# Patient Record
Sex: Female | Born: 1955
Health system: Southern US, Community
[De-identification: ages and names within clinical notes are randomized; demographics above are authoritative.]

## PROBLEM LIST (undated history)

## (undated) DIAGNOSIS — E162 Hypoglycemia, unspecified: Secondary | ICD-10-CM

## (undated) DIAGNOSIS — R51 Headache: Secondary | ICD-10-CM

## (undated) DIAGNOSIS — E785 Hyperlipidemia, unspecified: Secondary | ICD-10-CM

## (undated) DIAGNOSIS — F419 Anxiety disorder, unspecified: Secondary | ICD-10-CM

## (undated) DIAGNOSIS — N951 Menopausal and female climacteric states: Secondary | ICD-10-CM

## (undated) DIAGNOSIS — G4700 Insomnia, unspecified: Secondary | ICD-10-CM

## (undated) DIAGNOSIS — Z9289 Personal history of other medical treatment: Secondary | ICD-10-CM

## (undated) DIAGNOSIS — R519 Headache, unspecified: Secondary | ICD-10-CM

## (undated) HISTORY — DX: Hyperlipidemia, unspecified: E78.5

## (undated) HISTORY — DX: Headache, unspecified: R51.9

## (undated) HISTORY — DX: Anxiety disorder, unspecified: F41.9

## (undated) HISTORY — DX: Headache: R51

## (undated) HISTORY — DX: Menopausal and female climacteric states: N95.1

## (undated) HISTORY — DX: Personal history of other medical treatment: Z92.89

## (undated) HISTORY — DX: Hypoglycemia, unspecified: E16.2

## (undated) HISTORY — DX: Insomnia, unspecified: G47.00

---

## 2000-05-13 ENCOUNTER — Encounter: Payer: Self-pay | Admitting: Family Medicine

## 2000-05-13 ENCOUNTER — Ambulatory Visit (HOSPITAL_COMMUNITY): Admission: RE | Admit: 2000-05-13 | Discharge: 2000-05-13 | Payer: Self-pay | Admitting: Family Medicine

## 2003-04-25 ENCOUNTER — Other Ambulatory Visit: Admission: RE | Admit: 2003-04-25 | Discharge: 2003-04-25 | Payer: Self-pay | Admitting: Family Medicine

## 2004-04-28 ENCOUNTER — Other Ambulatory Visit: Admission: RE | Admit: 2004-04-28 | Discharge: 2004-04-28 | Payer: Self-pay | Admitting: Family Medicine

## 2005-05-06 ENCOUNTER — Other Ambulatory Visit: Admission: RE | Admit: 2005-05-06 | Discharge: 2005-05-06 | Payer: Self-pay | Admitting: Family Medicine

## 2006-12-14 ENCOUNTER — Other Ambulatory Visit: Admission: RE | Admit: 2006-12-14 | Discharge: 2006-12-14 | Payer: Self-pay | Admitting: Family Medicine

## 2007-09-21 ENCOUNTER — Emergency Department (HOSPITAL_COMMUNITY): Admission: EM | Admit: 2007-09-21 | Discharge: 2007-09-21 | Payer: Self-pay | Admitting: Emergency Medicine

## 2008-01-03 ENCOUNTER — Other Ambulatory Visit: Admission: RE | Admit: 2008-01-03 | Discharge: 2008-01-03 | Payer: Self-pay | Admitting: Family Medicine

## 2008-07-12 ENCOUNTER — Encounter: Admission: RE | Admit: 2008-07-12 | Discharge: 2008-07-12 | Payer: Self-pay | Admitting: Gastroenterology

## 2008-10-07 ENCOUNTER — Ambulatory Visit (HOSPITAL_COMMUNITY): Admission: RE | Admit: 2008-10-07 | Discharge: 2008-10-07 | Payer: Self-pay | Admitting: Gastroenterology

## 2008-10-23 ENCOUNTER — Encounter: Admission: RE | Admit: 2008-10-23 | Discharge: 2008-10-23 | Payer: Self-pay | Admitting: Family Medicine

## 2008-11-25 ENCOUNTER — Encounter: Admission: RE | Admit: 2008-11-25 | Discharge: 2008-11-25 | Payer: Self-pay | Admitting: Gastroenterology

## 2009-02-24 ENCOUNTER — Other Ambulatory Visit: Admission: RE | Admit: 2009-02-24 | Discharge: 2009-02-24 | Payer: Self-pay | Admitting: Family Medicine

## 2010-02-16 ENCOUNTER — Encounter: Payer: Self-pay | Admitting: Gastroenterology

## 2011-10-18 ENCOUNTER — Other Ambulatory Visit: Payer: Self-pay | Admitting: Family Medicine

## 2011-10-18 DIAGNOSIS — Z1231 Encounter for screening mammogram for malignant neoplasm of breast: Secondary | ICD-10-CM

## 2011-11-04 ENCOUNTER — Ambulatory Visit
Admission: RE | Admit: 2011-11-04 | Discharge: 2011-11-04 | Disposition: A | Discharge status: Home / Self Care | Payer: BC Managed Care – PPO | Source: Ambulatory Visit | Attending: Family Medicine | Admitting: Family Medicine

## 2011-11-04 DIAGNOSIS — Z1231 Encounter for screening mammogram for malignant neoplasm of breast: Secondary | ICD-10-CM

## 2012-02-15 ENCOUNTER — Other Ambulatory Visit: Payer: Self-pay | Admitting: Family Medicine

## 2012-02-15 ENCOUNTER — Other Ambulatory Visit (HOSPITAL_COMMUNITY)
Admission: RE | Admit: 2012-02-15 | Discharge: 2012-02-15 | Disposition: A | Discharge status: Home / Self Care | Payer: BC Managed Care – PPO | Source: Ambulatory Visit | Attending: Family Medicine | Admitting: Family Medicine

## 2012-02-15 DIAGNOSIS — Z0000 Encounter for general adult medical examination without abnormal findings: Secondary | ICD-10-CM | POA: Insufficient documentation

## 2012-10-03 ENCOUNTER — Other Ambulatory Visit: Payer: Self-pay

## 2012-10-03 DIAGNOSIS — Z1231 Encounter for screening mammogram for malignant neoplasm of breast: Secondary | ICD-10-CM

## 2012-11-06 ENCOUNTER — Ambulatory Visit
Admission: RE | Admit: 2012-11-06 | Discharge: 2012-11-06 | Disposition: A | Discharge status: Home / Self Care | Payer: BC Managed Care – PPO | Source: Ambulatory Visit

## 2012-11-06 DIAGNOSIS — Z1231 Encounter for screening mammogram for malignant neoplasm of breast: Secondary | ICD-10-CM

## 2013-10-03 ENCOUNTER — Other Ambulatory Visit: Payer: Self-pay

## 2013-10-03 DIAGNOSIS — Z1231 Encounter for screening mammogram for malignant neoplasm of breast: Secondary | ICD-10-CM

## 2013-11-13 ENCOUNTER — Ambulatory Visit
Admission: RE | Admit: 2013-11-13 | Discharge: 2013-11-13 | Disposition: A | Discharge status: Home / Self Care | Payer: BC Managed Care – PPO | Source: Ambulatory Visit

## 2013-11-13 DIAGNOSIS — Z1231 Encounter for screening mammogram for malignant neoplasm of breast: Secondary | ICD-10-CM

## 2014-12-18 ENCOUNTER — Other Ambulatory Visit: Payer: Self-pay

## 2014-12-18 DIAGNOSIS — Z1231 Encounter for screening mammogram for malignant neoplasm of breast: Secondary | ICD-10-CM

## 2015-01-24 ENCOUNTER — Ambulatory Visit: Payer: Self-pay

## 2015-01-27 MED FILL — SIMVASTATIN 20 MG TABLET: 20 | 30 days supply | Qty: 30 | Fill #3

## 2015-02-06 ENCOUNTER — Ambulatory Visit
Admission: RE | Admit: 2015-02-06 | Discharge: 2015-02-06 | Disposition: A | Discharge status: Home / Self Care | Payer: BLUE CROSS/BLUE SHIELD | Source: Ambulatory Visit

## 2015-02-06 DIAGNOSIS — Z1231 Encounter for screening mammogram for malignant neoplasm of breast: Secondary | ICD-10-CM

## 2015-02-24 MED FILL — SIMVASTATIN 20 MG TABLET: 20 | 30 days supply | Qty: 30 | Fill #4

## 2015-03-06 MED FILL — SERTRALINE HCL 25 MG TABLET: 25 | 30 days supply | Qty: 30 | Fill #2

## 2015-03-27 MED FILL — SIMVASTATIN 20 MG TABLET: 20 | 30 days supply | Qty: 30 | Fill #5

## 2015-04-21 MED FILL — XIIDRA 5% EYE DROPS: 5 | 30 days supply | Qty: 60 | Fill #0

## 2015-04-25 ENCOUNTER — Encounter: Payer: Self-pay | Admitting: *Deleted

## 2015-04-28 ENCOUNTER — Ambulatory Visit (INDEPENDENT_AMBULATORY_CARE_PROVIDER_SITE_OTHER): Payer: BLUE CROSS/BLUE SHIELD | Admitting: Neurology

## 2015-04-28 ENCOUNTER — Encounter: Payer: Self-pay | Admitting: Neurology

## 2015-04-28 VITALS — BP 107/71 | HR 70 | Ht 67.0 in | Wt 131.5 lb

## 2015-04-28 DIAGNOSIS — G441 Vascular headache, not elsewhere classified: Secondary | ICD-10-CM

## 2015-04-28 DIAGNOSIS — R519 Headache, unspecified: Secondary | ICD-10-CM | POA: Insufficient documentation

## 2015-04-28 DIAGNOSIS — R51 Headache: Secondary | ICD-10-CM

## 2015-04-28 MED ORDER — ALPRAZOLAM 0.5 MG PO TABS: ORAL_TABLET | ORAL | Status: DC

## 2015-04-28 NOTE — Progress Notes (Signed)
Reason for visit: Headache  Referring physician: Dr. Carol Ada  Erika Wilkins is a 60 y.o. female  History of present illness:  Erika Wilkins is a 60 year old right-handed white female with out significant medical history. She indicates that around 09/26/2014 she was carrying heavy boxes, on the drive back home she began noting a relatively sudden onset of some sensation of nausea, lightheadedness, generalized weakness. She felt poorly for several days. She had blood work done within the next week, a CBC, comprehensive metabolic profile, and TSH were unremarkable with exception of a slightly high calcium level. She was seen by her eye doctor around September 26 when she developed some achy pain and dryness of the left eye. The patient was never found to have any eye pathology. She underwent an evaluation through her dentist for possible TMJ, she was felt to have some crepitus in the TMJ joints. By March, she was having some neck discomfort and some headache pain in the back of the head, particularly on the right. She has also noted some "crinkling" sensations inside the head, and she also has similar sensations when she turns her head with a sensation in the neck. The patient notes that if she puts pressure on the back of the head, the head pain increases. She denies any numbness or weakness of the arms, body, or legs. She denies any loss of vision, or double vision. She has not had any weakness or gait instability or problems controlling the bowels or the bladder. She is sent to this office for an evaluation.  Past Medical History  Diagnosis Date  . Hyperlipidemia   . Insomnia   . Hypoglycemia   . Menopausal symptoms   . History of nuclear stress test     Normal nuclear stress test 07/14/2009 dr. Marlou Porch  . Anxiety   . Headache     History reviewed. No pertinent past surgical history.  Family History  Problem Relation Age of Onset  . Heart disease Mother   . CAD Mother   .  Parkinson's disease Father   . Diabetes Sister   . Liver cancer Brother   . CAD Paternal Grandfather   . Pneumonia Brother     Social history:  reports that she has never smoked. She does not have any smokeless tobacco history on file. She reports that she does not drink alcohol or use illicit drugs.  Medications:  Prior to Admission medications   Medication Sig Start Date End Date Taking? Authorizing Provider  LORazepam (ATIVAN) 0.5 MG tablet Take 0.5 mg by mouth daily as needed for anxiety (Takes 1/2 tablet prn).   Yes Historical Provider, Wilkins  sertraline (ZOLOFT) 25 MG tablet Take 25 mg by mouth daily. She takes 1/2 tablet   Yes Historical Provider, Wilkins  simvastatin (ZOCOR) 20 MG tablet Take 20 mg by mouth daily. Simvastatin 20 MG Tablet TAKE 1 TABLET BY MOUTH ONCE A DAY Once a day   Yes Historical Provider, Wilkins  XIIDRA 5 % SOLN  04/21/15  Yes Historical Provider, Wilkins     No Known Allergies  ROS:  Out of a complete 14 system review of symptoms, the patient complains only of the following symptoms, and all other reviewed systems are negative.  Eye pain Increased thirst Headache Anxiety  Blood pressure 107/71, pulse 70, height 5\' 7"  (1.702 m), weight 131 lb 8 oz (59.648 kg).  Physical Exam  General: The patient is alert and cooperative at the time of the examination.  Eyes: Pupils are equal, round, and reactive to light. Discs are flat bilaterally.  Neck: The neck is supple, no carotid bruits are noted.  Respiratory: The respiratory examination is clear.  Cardiovascular: The cardiovascular examination reveals a regular rate and rhythm, no obvious murmurs or rubs are noted.  Neuromuscular: Range of movement of the cervical spine is full. The patient has crepitus in the temporomandibular joints bilaterally, left greater than right.  Skin: Extremities are without significant edema.  Neurologic Exam  Mental status: The patient is alert and oriented x 3 at the time of the  examination. The patient has apparent normal recent and remote memory, with an apparently normal attention span and concentration ability.  Cranial nerves: Facial symmetry is present. There is good sensation of the face to pinprick and soft touch bilaterally. The strength of the facial muscles and the muscles to head turning and shoulder shrug are normal bilaterally. Speech is well enunciated, no aphasia or dysarthria is noted. Extraocular movements are full. Visual fields are full. The tongue is midline, and the patient has symmetric elevation of the soft palate. No obvious hearing deficits are noted.  Motor: The motor testing reveals 5 over 5 strength of all 4 extremities. Good symmetric motor tone is noted throughout.  Sensory: Sensory testing is intact to pinprick, soft touch, vibration sensation, and position sense on all 4 extremities. No evidence of extinction is noted.  Coordination: Cerebellar testing reveals good finger-nose-finger and heel-to-shin bilaterally.  Gait and station: Gait is normal. Tandem gait is normal. Romberg is negative. No drift is seen.  Reflexes: Deep tendon reflexes are symmetric and normal bilaterally. Toes are downgoing bilaterally.   Assessment/Plan:  1. Left eye discomfort  2. Posterior head pain  The patient reports some discomfort around the left eye, a "crinkling sensation" inside the head, she also reports sensations consistent with crepitus in the neck. The patient reports posterior head pain and some neck discomfort. The patient could have a cervicogenic headache. She denies any prior history of migraine headache earlier in her life. The patient will be set up for some blood work today, and she will have MRI evaluation of the brain. The clinical examination today is unremarkable. If the studies are normal, and the patient is still having ongoing symptoms, we may consider initiating treatment of the headache with gabapentin. She will follow-up in 4  months. A prescription was given for Xanax for the MRI.  Erika Wilkins 04/28/2015 1:21 PM  [facility] [address] [place], [place] [address]  Phone [phone] Fax [phone]

## 2015-04-29 ENCOUNTER — Telehealth: Payer: Self-pay | Admitting: Neurology

## 2015-04-29 LAB — BASIC METABOLIC PANEL
BUN/Creatinine Ratio: 16 (ref 12–28)
BUN: 13 mg/dL (ref 8–27)
CALCIUM: 10.1 mg/dL (ref 8.7–10.3)
CO2: 28 mmol/L (ref 18–29)
CREATININE: 0.82 mg/dL (ref 0.57–1.00)
Chloride: 100 mmol/L (ref 96–106)
GFR, EST AFRICAN AMERICAN: 90 mL/min/{1.73_m2} (ref >59)
GFR, EST NON AFRICAN AMERICAN: 78 mL/min/{1.73_m2} (ref >59)
Glucose: 89 mg/dL (ref 65–99)
POTASSIUM: 5.1 mmol/L (ref 3.5–5.2)
Sodium: 143 mmol/L (ref 134–144)

## 2015-04-29 LAB — ANA W/REFLEX: Anti Nuclear Antibody(ANA): NEGATIVE

## 2015-04-29 LAB — SEDIMENTATION RATE: SED RATE: 2 mm/h (ref 0–40)

## 2015-04-29 NOTE — Telephone Encounter (Signed)
Spoke with patient and advised her that most likely Dr Jannifer Franklin would not prescribe at this time since his office note did not indicate he would until after her studies are completed. Advised she take Ibuprofen and /or Tylenol if these are medications she can take. She stated she can and would continue with these. Her MRI is this weekend. Advised she call for any new issues, problems. She verbalized understanding, appreciation.

## 2015-04-29 NOTE — Telephone Encounter (Signed)
Pt said she forgot to ask at appt yesterday what pain medication for headaches would Dr Jannifer Franklin recommend, otc or RX until she has MRI.

## 2015-04-30 ENCOUNTER — Other Ambulatory Visit (HOSPITAL_COMMUNITY)
Admission: RE | Admit: 2015-04-30 | Discharge: 2015-04-30 | Disposition: A | Discharge status: Home / Self Care | Payer: BLUE CROSS/BLUE SHIELD | Source: Ambulatory Visit | Attending: Family Medicine | Admitting: Family Medicine

## 2015-04-30 ENCOUNTER — Other Ambulatory Visit: Payer: Self-pay | Admitting: Family Medicine

## 2015-04-30 ENCOUNTER — Telehealth: Payer: Self-pay | Admitting: *Deleted

## 2015-04-30 DIAGNOSIS — Z124 Encounter for screening for malignant neoplasm of cervix: Secondary | ICD-10-CM | POA: Diagnosis not present

## 2015-04-30 DIAGNOSIS — Z1211 Encounter for screening for malignant neoplasm of colon: Secondary | ICD-10-CM | POA: Diagnosis not present

## 2015-04-30 DIAGNOSIS — Z0100 Encounter for examination of eyes and vision without abnormal findings: Secondary | ICD-10-CM | POA: Diagnosis not present

## 2015-04-30 DIAGNOSIS — Z01419 Encounter for gynecological examination (general) (routine) without abnormal findings: Secondary | ICD-10-CM | POA: Diagnosis not present

## 2015-04-30 DIAGNOSIS — E7800 Pure hypercholesterolemia, unspecified: Secondary | ICD-10-CM | POA: Diagnosis not present

## 2015-04-30 DIAGNOSIS — Z0000 Encounter for general adult medical examination without abnormal findings: Secondary | ICD-10-CM | POA: Diagnosis not present

## 2015-04-30 DIAGNOSIS — H5310 Unspecified subjective visual disturbances: Secondary | ICD-10-CM | POA: Diagnosis not present

## 2015-04-30 MED FILL — SIMVASTATIN 20 MG TABLET: 20 | 90 days supply | Qty: 90 | Fill #0

## 2015-04-30 MED FILL — ALPRAZolam 0.5 MG TABS: 0.5 | 1 days supply | Qty: 3 | Fill #0

## 2015-04-30 MED FILL — SERTRALINE HCL 25 MG TABLET: 25 | 90 days supply | Qty: 90 | Fill #0

## 2015-04-30 NOTE — Telephone Encounter (Signed)
-----   Message from Kathrynn Ducking, MD sent at 04/29/2015  4:37 PM EDT -----  The blood work results are unremarkable. Please call the patient.  ----- Message -----    From: [facility] Lab Results In Interface    Sent: 04/29/2015   7:42 AM      To: Kathrynn Ducking, MD

## 2015-04-30 NOTE — Telephone Encounter (Signed)
Spoke to pt and relayed lab results.   Faxed to Dr. Carol Ada, since pt has appt today at 1000.  (received fax confirmation).  She was asking if ok to take excedrin migraine for her headache (as motrin and tylenol not helping), she may try this and see if helps.  Otherwise will await other test results.

## 2015-05-02 LAB — CYTOLOGY - PAP

## 2015-05-03 ENCOUNTER — Ambulatory Visit
Admission: RE | Admit: 2015-05-03 | Discharge: 2015-05-03 | Disposition: A | Discharge status: Home / Self Care | Payer: BLUE CROSS/BLUE SHIELD | Source: Ambulatory Visit | Attending: Neurology | Admitting: Neurology

## 2015-05-03 DIAGNOSIS — G441 Vascular headache, not elsewhere classified: Secondary | ICD-10-CM | POA: Diagnosis not present

## 2015-05-03 DIAGNOSIS — R51 Headache: Secondary | ICD-10-CM | POA: Diagnosis not present

## 2015-05-03 MED ORDER — GADOBENATE DIMEGLUMINE 529 MG/ML IV SOLN
12.0000 mL | Freq: Once | INTRAVENOUS | Status: AC | PRN
  Administered 2015-05-03: 12 mL via INTRAVENOUS

## 2015-05-05 ENCOUNTER — Other Ambulatory Visit (INDEPENDENT_AMBULATORY_CARE_PROVIDER_SITE_OTHER): Payer: Self-pay

## 2015-05-05 ENCOUNTER — Telehealth: Payer: Self-pay | Admitting: Neurology

## 2015-05-05 DIAGNOSIS — G441 Vascular headache, not elsewhere classified: Secondary | ICD-10-CM | POA: Diagnosis not present

## 2015-05-05 DIAGNOSIS — Z0289 Encounter for other administrative examinations: Secondary | ICD-10-CM

## 2015-05-05 NOTE — Telephone Encounter (Signed)
I called patient. MRI the brain shows mild white matter changes that are nonspecific, not typical for MS. The patient does not have a significant sinus disease. The patient does report exposure to ticks in August 2016. We will check a Lyme antibody panel.   MRI brain 05/02/15:  IMPRESSION: This MRI of the brain with and without contrast shows the following: 1. Many T2/FLAIR hyperintense foci in the deep and subcortical white matter of both hemispheres. This is a nonspecific finding but most likely represents chronic microvascular ischemic change. The extent is more than expected for age. Demyelination would be less likely to give this pattern. 2. There are no acute findings.

## 2015-05-06 ENCOUNTER — Telehealth: Payer: Self-pay

## 2015-05-06 LAB — B. BURGDORFI ANTIBODIES: Lyme IgG/IgM Ab: <0.91 {ISR} (ref 0.00–0.90)

## 2015-05-06 NOTE — Telephone Encounter (Signed)
Spoke to [place] and gave normal lab results. She verbalized understanding and appreciation for call.

## 2015-05-06 NOTE — Telephone Encounter (Signed)
-----   Message from Kathrynn Ducking, MD sent at 05/06/2015  2:45 PM EDT -----  The blood work results are unremarkable. Please call the patient.  ----- Message -----    From: [facility] Lab Results In Interface    Sent: 05/06/2015   1:37 PM      To: Kathrynn Ducking, MD

## 2015-07-28 MED FILL — SIMVASTATIN 20 MG TABLET: 20 | 90 days supply | Qty: 90 | Fill #1

## 2015-08-18 DIAGNOSIS — J309 Allergic rhinitis, unspecified: Secondary | ICD-10-CM | POA: Diagnosis not present

## 2015-08-18 DIAGNOSIS — K219 Gastro-esophageal reflux disease without esophagitis: Secondary | ICD-10-CM | POA: Diagnosis not present

## 2015-08-18 DIAGNOSIS — J320 Chronic maxillary sinusitis: Secondary | ICD-10-CM | POA: Diagnosis not present

## 2015-08-18 DIAGNOSIS — R51 Headache: Secondary | ICD-10-CM | POA: Diagnosis not present

## 2015-08-29 ENCOUNTER — Ambulatory Visit: Payer: BLUE CROSS/BLUE SHIELD | Admitting: Neurology

## 2015-10-27 MED FILL — SIMVASTATIN 20 MG TABLET: 20 | 90 days supply | Qty: 90 | Fill #0

## 2015-10-30 MED FILL — SERTRALINE HCL 25 MG TABLET: 25 | 90 days supply | Qty: 90 | Fill #1

## 2015-11-04 DIAGNOSIS — F419 Anxiety disorder, unspecified: Secondary | ICD-10-CM | POA: Diagnosis not present

## 2015-11-04 DIAGNOSIS — E7800 Pure hypercholesterolemia, unspecified: Secondary | ICD-10-CM | POA: Diagnosis not present

## 2015-11-18 MED FILL — LORazepam 0.5 MG TABS: 0.5 | 10 days supply | Qty: 30 | Fill #0

## 2015-11-28 DIAGNOSIS — H43812 Vitreous degeneration, left eye: Secondary | ICD-10-CM | POA: Diagnosis not present

## 2016-01-09 DIAGNOSIS — H43812 Vitreous degeneration, left eye: Secondary | ICD-10-CM | POA: Diagnosis not present

## 2016-02-03 MED FILL — SIMVASTATIN 20 MG TABLET: 20 | 45 days supply | Qty: 90 | Fill #0

## 2016-05-07 MED FILL — SIMVASTATIN 20 MG TABLET: 20 | 45 days supply | Qty: 90 | Fill #1

## 2016-05-12 DIAGNOSIS — Z0000 Encounter for general adult medical examination without abnormal findings: Secondary | ICD-10-CM | POA: Diagnosis not present

## 2016-05-12 DIAGNOSIS — E7800 Pure hypercholesterolemia, unspecified: Secondary | ICD-10-CM | POA: Diagnosis not present

## 2016-05-12 DIAGNOSIS — F419 Anxiety disorder, unspecified: Secondary | ICD-10-CM | POA: Diagnosis not present

## 2016-05-17 ENCOUNTER — Other Ambulatory Visit: Payer: Self-pay | Admitting: Family Medicine

## 2016-05-17 DIAGNOSIS — Z1231 Encounter for screening mammogram for malignant neoplasm of breast: Secondary | ICD-10-CM

## 2016-06-02 ENCOUNTER — Ambulatory Visit
Admission: RE | Admit: 2016-06-02 | Discharge: 2016-06-02 | Disposition: A | Discharge status: Home / Self Care | Payer: BLUE CROSS/BLUE SHIELD | Source: Ambulatory Visit | Attending: Family Medicine | Admitting: Family Medicine

## 2016-06-02 DIAGNOSIS — Z1231 Encounter for screening mammogram for malignant neoplasm of breast: Secondary | ICD-10-CM

## 2016-06-10 MED FILL — SERTRALINE HCL 25 MG TABLET: 25 | 90 days supply | Qty: 90 | Fill #0

## 2016-07-27 DIAGNOSIS — R0789 Other chest pain: Secondary | ICD-10-CM | POA: Diagnosis not present

## 2016-07-27 DIAGNOSIS — S161XXA Strain of muscle, fascia and tendon at neck level, initial encounter: Secondary | ICD-10-CM | POA: Diagnosis not present

## 2016-08-05 MED FILL — SIMVASTATIN 20 MG TABLET: 20 | 90 days supply | Qty: 90 | Fill #0

## 2016-08-06 DIAGNOSIS — H43811 Vitreous degeneration, right eye: Secondary | ICD-10-CM | POA: Diagnosis not present

## 2016-09-01 DIAGNOSIS — H43811 Vitreous degeneration, right eye: Secondary | ICD-10-CM | POA: Diagnosis not present

## 2016-11-01 MED FILL — SIMVASTATIN 20 MG TABLET: 20 | 90 days supply | Qty: 90 | Fill #1

## 2016-12-07 MED FILL — LORazepam 0.5 MG TABS: 0.5 | 10 days supply | Qty: 30 | Fill #0

## 2016-12-07 MED FILL — SERTRALINE HCL 25 MG TABLET: 25 | 90 days supply | Qty: 90 | Fill #1

## 2017-02-04 MED FILL — SIMVASTATIN 20 MG TABLET: 20 | 90 days supply | Qty: 90 | Fill #0

## 2017-05-04 MED FILL — SIMVASTATIN 20 MG TABS: 20 | 90 days supply | Qty: 90 | Fill #1

## 2017-05-19 DIAGNOSIS — Z0000 Encounter for general adult medical examination without abnormal findings: Secondary | ICD-10-CM | POA: Diagnosis not present

## 2017-05-19 DIAGNOSIS — E7800 Pure hypercholesterolemia, unspecified: Secondary | ICD-10-CM | POA: Diagnosis not present

## 2017-06-01 DIAGNOSIS — H1033 Unspecified acute conjunctivitis, bilateral: Secondary | ICD-10-CM | POA: Diagnosis not present

## 2017-07-13 ENCOUNTER — Other Ambulatory Visit: Payer: Self-pay | Admitting: Family Medicine

## 2017-07-13 DIAGNOSIS — Z1231 Encounter for screening mammogram for malignant neoplasm of breast: Secondary | ICD-10-CM

## 2017-08-03 ENCOUNTER — Ambulatory Visit
Admission: RE | Admit: 2017-08-03 | Discharge: 2017-08-03 | Disposition: A | Discharge status: Home / Self Care | Payer: BLUE CROSS/BLUE SHIELD | Source: Ambulatory Visit | Attending: Family Medicine | Admitting: Family Medicine

## 2017-08-03 DIAGNOSIS — Z1231 Encounter for screening mammogram for malignant neoplasm of breast: Secondary | ICD-10-CM

## 2017-08-17 MED FILL — SIMVASTATIN 20 MG TABLET: 20 | 90 days supply | Qty: 90 | Fill #0

## 2017-09-13 MED FILL — PEG-3350 SOLUTION: 420 | 1 days supply | Qty: 4000 | Fill #0

## 2017-09-15 DIAGNOSIS — Z1211 Encounter for screening for malignant neoplasm of colon: Secondary | ICD-10-CM | POA: Diagnosis not present

## 2017-09-15 DIAGNOSIS — D126 Benign neoplasm of colon, unspecified: Secondary | ICD-10-CM | POA: Diagnosis not present

## 2017-09-20 DIAGNOSIS — D126 Benign neoplasm of colon, unspecified: Secondary | ICD-10-CM | POA: Diagnosis not present

## 2017-10-14 DIAGNOSIS — H2513 Age-related nuclear cataract, bilateral: Secondary | ICD-10-CM | POA: Diagnosis not present

## 2017-10-14 DIAGNOSIS — H5712 Ocular pain, left eye: Secondary | ICD-10-CM | POA: Diagnosis not present

## 2017-10-25 DIAGNOSIS — J069 Acute upper respiratory infection, unspecified: Secondary | ICD-10-CM | POA: Diagnosis not present

## 2017-12-26 DIAGNOSIS — E7800 Pure hypercholesterolemia, unspecified: Secondary | ICD-10-CM | POA: Diagnosis not present

## 2017-12-26 DIAGNOSIS — R079 Chest pain, unspecified: Secondary | ICD-10-CM | POA: Diagnosis not present

## 2017-12-26 DIAGNOSIS — R1012 Left upper quadrant pain: Secondary | ICD-10-CM | POA: Diagnosis not present

## 2017-12-28 ENCOUNTER — Telehealth: Payer: Self-pay

## 2017-12-28 NOTE — Telephone Encounter (Signed)
SENT REFERRAL TO SCHEDULING AND FILED NOTES 

## 2018-01-04 MED FILL — SIMVASTATIN 20 MG TABLET: 20 | 90 days supply | Qty: 90 | Fill #0

## 2018-01-19 ENCOUNTER — Ambulatory Visit: Payer: BLUE CROSS/BLUE SHIELD | Admitting: Cardiovascular Disease

## 2018-01-19 ENCOUNTER — Encounter: Payer: Self-pay | Admitting: Cardiovascular Disease

## 2018-01-19 DIAGNOSIS — Z8249 Family history of ischemic heart disease and other diseases of the circulatory system: Secondary | ICD-10-CM | POA: Insufficient documentation

## 2018-01-19 DIAGNOSIS — E782 Mixed hyperlipidemia: Secondary | ICD-10-CM

## 2018-01-19 DIAGNOSIS — R0789 Other chest pain: Secondary | ICD-10-CM | POA: Insufficient documentation

## 2018-01-19 DIAGNOSIS — E785 Hyperlipidemia, unspecified: Secondary | ICD-10-CM | POA: Insufficient documentation

## 2018-01-19 NOTE — Patient Instructions (Addendum)
Medication Instructions:  Your physician recommends that you continue on your current medications as directed. Please refer to the Current Medication list given to you today.  If you need a refill on your cardiac medications before your next appointment, please call your pharmacy.   Lab work: NONE If you have labs (blood work) drawn today and your tests are completely normal, you will receive your results only by: Marland Kitchen MyChart Message (if you have MyChart) OR . A paper copy in the mail If you have any lab test that is abnormal or we need to change your treatment, we will call you to review the results.  Testing/Procedures: Your physician has requested that you have an exercise tolerance test. For further information please visit [email or web address]. Please also follow instruction sheet, as given.   Follow-Up: At [facility], you and your health needs are our priority.  As part of our continuing mission to provide you with exceptional heart care, we have created designated Provider Care Teams.  These Care Teams include your primary Cardiologist (physician) and Advanced Practice Providers (APPs -  Physician Assistants and Nurse Practitioners) who all work together to provide you with the care you need, when you need it. You will need a follow up appointment in 12 months.  Please call our office 2 months in advance to schedule this appointment.  You may see DR. BERRY  or one of the following Advanced Practice Providers on your designated Care Team:   Kerin Ransom, PA-C [place], [place] . Sande Rives, PA-C

## 2018-01-19 NOTE — Assessment & Plan Note (Signed)
History of hyperlipidemia on simvastatin in the past which she stopped in August on her own.  Her most recent lipid profile performed 12/27/2017 revealed total cholesterol 260, LDL 146 and HDL of 83.  She was begun back on her simvastatin by her PCP.

## 2018-01-19 NOTE — Progress Notes (Signed)
01/19/2018 TASNIM BALENTINE   May 26, 1955  [id number]  Primary Physician Carol Ada, MD Primary Cardiologist: Lorretta Harp MD [place], [place]  HPI:  Erika Wilkins is a 62 y.o. thin appearing married Caucasian female mother of 2 children, grandmother of one grandchild who works in a Administrator, [employer] and was referred by Dr. Carol Ada for cardiovascular valuation because of atypical chest pain.  Her only risk factors include treated hyperlipidemia and family history with a mother who died of a myocardial infarction age 70 and a sister who is had a an MI as well.  She is never had a heart attack or stroke.  She is otherwise asymptomatic.  She had 2 episodes of brief chest pain around Thanksgiving and none since.  She has been evaluated by Dr. Marlou Porch approximately 8 years ago for similar symptoms and had negative stress test at that time.   Current Meds  Medication Sig  . LORazepam (ATIVAN) 0.5 MG tablet Take 0.5 mg by mouth daily as needed for anxiety (Takes 1/2 tablet prn).  . simvastatin (ZOCOR) 20 MG tablet Take 20 mg by mouth daily. Simvastatin 20 MG Tablet TAKE 1 TABLET BY MOUTH ONCE A DAY Once a day     No Known Allergies  Social History   Socioeconomic History  . Marital status: Married    Spouse name: Not on file  . Number of children: Not on file  . Years of education: Not on file  . Highest education level: Not on file  Occupational History  . Not on file  Social Needs  . Financial resource strain: Not on file  . Food insecurity:    Worry: Not on file    Inability: Not on file  . Transportation needs:    Medical: Not on file    Non-medical: Not on file  Tobacco Use  . Smoking status: Never Smoker  . Smokeless tobacco: Never Used  Substance and Sexual Activity  . Alcohol use: No    Alcohol/week: 0.0 standard drinks  . Drug use: No  . Sexual activity: Not on file  Lifestyle  . Physical activity:    Days per week: Not on file    Minutes  per session: Not on file  . Stress: Not on file  Relationships  . Social connections:    Talks on phone: Not on file    Gets together: Not on file    Attends religious service: Not on file    Active member of club or organization: Not on file    Attends meetings of clubs or organizations: Not on file    Relationship status: Not on file  . Intimate partner violence:    Fear of current or ex partner: Not on file    Emotionally abused: Not on file    Physically abused: Not on file    Forced sexual activity: Not on file  Other Topics Concern  . Not on file  Social History Narrative   Married to [place], 2 boys Jonni Sanger and Loa Socks).   College grad [employer].  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       Review of Systems: General: negative for chills, fever, night sweats or weight changes.  Cardiovascular: negative for chest pain, dyspnea on exertion, edema, orthopnea, palpitations, paroxysmal nocturnal dyspnea or shortness of breath Dermatological: negative for rash Respiratory: negative for cough or wheezing Urologic: negative for hematuria Abdominal: negative for nausea, vomiting, diarrhea, bright red blood per rectum, melena, or hematemesis  Neurologic: negative for visual changes, syncope, or dizziness All other systems reviewed and are otherwise negative except as noted above.    Blood pressure 114/74, pulse 73, height 5\' 7"  (1.702 m), weight 128 lb (58.1 kg).  General appearance: alert and no distress Neck: no adenopathy, no carotid bruit, no JVD, supple, symmetrical, trachea midline and thyroid not enlarged, symmetric, no tenderness/mass/nodules Lungs: clear to auscultation bilaterally Heart: regular rate and rhythm, S1, S2 normal, no murmur, click, rub or gallop Extremities: extremities normal, atraumatic, no cyanosis or edema Pulses: 2+ and symmetric Skin: Skin color, texture, turgor normal. No rashes or lesions Neurologic: Alert and oriented X 3, normal strength and tone. Normal  symmetric reflexes. Normal coordination and gait  EKG sinus rhythm at 73 with nonspecific ST and T wave changes.  Personally reviewed this EKG.  ASSESSMENT AND PLAN:   Hyperlipidemia History of hyperlipidemia on simvastatin in the past which she stopped in August on her own.  Her most recent lipid profile performed 12/27/2017 revealed total cholesterol 260, LDL 146 and HDL of 83.  She was begun back on her simvastatin by her PCP.  Atypical chest pain Episode of atypical chest pain back around Thanksgiving, none since.  She did have a stress test per by Dr. Marlou Porch approximately 8 years ago for similar symptoms.  We will obtain a routine GXT to rule out myocardial ischemia especially in light of her family history.      Lorretta Harp MD FACP,FACC,FAHA, [facility] 01/19/2018 9:27 AM

## 2018-01-19 NOTE — Assessment & Plan Note (Signed)
Episode of atypical chest pain back around Thanksgiving, none since.  She did have a stress test per by Dr. Marlou Porch approximately 8 years ago for similar symptoms.  We will obtain a routine GXT to rule out myocardial ischemia especially in light of her family history.

## 2018-01-31 ENCOUNTER — Telehealth (HOSPITAL_COMMUNITY): Payer: Self-pay

## 2018-01-31 NOTE — Telephone Encounter (Signed)
Encounter complete. 

## 2018-02-02 ENCOUNTER — Ambulatory Visit (HOSPITAL_COMMUNITY)
Admission: RE | Admit: 2018-02-02 | Discharge: 2018-02-02 | Disposition: A | Discharge status: Home / Self Care | Payer: BLUE CROSS/BLUE SHIELD | Source: Ambulatory Visit | Attending: Cardiovascular Disease | Admitting: Cardiovascular Disease

## 2018-02-02 ENCOUNTER — Ambulatory Visit: Payer: BLUE CROSS/BLUE SHIELD | Admitting: Cardiology

## 2018-02-02 DIAGNOSIS — R0789 Other chest pain: Secondary | ICD-10-CM | POA: Insufficient documentation

## 2018-02-02 LAB — EXERCISE TOLERANCE TEST
CHL CUP MPHR: 157 {beats}/min
CHL RATE OF PERCEIVED EXERTION: 17
CSEPED: 11 min
Estimated workload: 13.4 METS
Exercise duration (sec): 0 s
Peak HR: 148 {beats}/min
Percent HR: 94 %
Rest HR: 75 {beats}/min

## 2018-02-14 ENCOUNTER — Telehealth: Payer: Self-pay

## 2018-02-14 ENCOUNTER — Other Ambulatory Visit: Payer: Self-pay

## 2018-02-14 DIAGNOSIS — E782 Mixed hyperlipidemia: Secondary | ICD-10-CM

## 2018-02-14 MED ORDER — SIMVASTATIN 40 MG PO TABS: 40.0000 mg | ORAL_TABLET | Freq: Every day | ORAL | 3 refills | Status: DC

## 2018-02-14 MED ORDER — SIMVASTATIN 20 MG PO TABS: 20.0000 mg | ORAL_TABLET | Freq: Every day | ORAL | 3 refills | Status: DC

## 2018-02-14 NOTE — Telephone Encounter (Signed)
LMTCB

## 2018-02-14 NOTE — Addendum Note (Signed)
Addended by: Annita Brod on: 02/14/2018 01:40 PM   Modules accepted: Orders

## 2018-02-14 NOTE — Addendum Note (Signed)
Addended by: Annita Brod on: 02/14/2018 06:22 PM   Modules accepted: Orders

## 2018-02-14 NOTE — Telephone Encounter (Signed)
Patient returned call to office. Advised pt of Dr. Gwenlyn Found recommendation to increase simvastatin to 40 mg daily due to results from lab work on 12/26/2017 and recheck labs. Pt stated that she feels her lipid results are d/t her making the decision to not take the simvastatin for months. She states she restarted the medication around the time of the last lipid panel and has been taking her simvastatin 20 mg since then. Pt states that she would rather return for a lipid panel recheck than increase her simvastatin to 40 mg at this time. Pt states she will continue to take 20 mg of simvastatin daily unless otherwise advised after lipid/liver panel recheck. Advised pt to return for lab work in 1-2 weeks. Informed pt that lab slip would be sent in the mail and that message would be routed to Dr. Gwenlyn Found. Pt verbalized understanding

## 2018-03-02 ENCOUNTER — Telehealth: Payer: Self-pay | Admitting: Cardiovascular Disease

## 2018-03-02 DIAGNOSIS — R079 Chest pain, unspecified: Secondary | ICD-10-CM | POA: Diagnosis not present

## 2018-03-02 DIAGNOSIS — E782 Mixed hyperlipidemia: Secondary | ICD-10-CM | POA: Diagnosis not present

## 2018-03-02 LAB — HEPATIC FUNCTION PANEL
ALT: 21 IU/L (ref 0–32)
AST: 23 IU/L (ref 0–40)
Albumin: 4.7 g/dL (ref 3.8–4.8)
Alkaline Phosphatase: 59 IU/L (ref 39–117)
Bilirubin Total: 0.8 mg/dL (ref 0.0–1.2)
Bilirubin, Direct: 0.19 mg/dL (ref 0.00–0.40)
Total Protein: 6.9 g/dL (ref 6.0–8.5)

## 2018-03-02 LAB — LIPID PANEL
Chol/HDL Ratio: 2.4 ratio (ref 0.0–4.4)
Cholesterol, Total: 213 mg/dL — ABNORMAL HIGH (ref 100–199)
HDL: 89 mg/dL (ref >39)
LDL Calculated: 105 mg/dL — ABNORMAL HIGH (ref 0–99)
Triglycerides: 96 mg/dL (ref 0–149)
VLDL Cholesterol Cal: 19 mg/dL (ref 5–40)

## 2018-03-02 NOTE — Telephone Encounter (Signed)
Rerouted to Dr. Gwenlyn Found

## 2018-03-02 NOTE — Telephone Encounter (Signed)
Pt message     Pt had concerns about extra lab work. I did mention that I can schedule labs shown but pt mentioned an additional one . Please follow up

## 2018-03-02 NOTE — Telephone Encounter (Signed)
Spoke with patient and she was calling regarding Troponin level order being faxed from PCP. Order found and Sasha called to see get added. Advised patient, verbalized understanding.

## 2018-03-02 NOTE — Telephone Encounter (Signed)
error 

## 2018-03-02 NOTE — Telephone Encounter (Signed)
That is fine with me.

## 2018-05-02 MED FILL — SIMVASTATIN 20 MG TABLET: 20 | 90 days supply | Qty: 90 | Fill #1

## 2018-08-16 ENCOUNTER — Other Ambulatory Visit: Payer: Self-pay | Admitting: Family Medicine

## 2018-08-16 ENCOUNTER — Other Ambulatory Visit (HOSPITAL_COMMUNITY)
Admission: RE | Admit: 2018-08-16 | Discharge: 2018-08-16 | Disposition: A | Discharge status: Home / Self Care | Payer: BC Managed Care – PPO | Source: Ambulatory Visit | Attending: Family Medicine | Admitting: Family Medicine

## 2018-08-16 DIAGNOSIS — Z124 Encounter for screening for malignant neoplasm of cervix: Secondary | ICD-10-CM | POA: Diagnosis not present

## 2018-08-16 DIAGNOSIS — E7800 Pure hypercholesterolemia, unspecified: Secondary | ICD-10-CM | POA: Diagnosis not present

## 2018-08-16 DIAGNOSIS — Z8249 Family history of ischemic heart disease and other diseases of the circulatory system: Secondary | ICD-10-CM | POA: Diagnosis not present

## 2018-08-16 DIAGNOSIS — Z0000 Encounter for general adult medical examination without abnormal findings: Secondary | ICD-10-CM | POA: Diagnosis not present

## 2018-08-16 DIAGNOSIS — Z79899 Other long term (current) drug therapy: Secondary | ICD-10-CM | POA: Diagnosis not present

## 2018-08-16 DIAGNOSIS — G4709 Other insomnia: Secondary | ICD-10-CM | POA: Diagnosis not present

## 2018-08-16 DIAGNOSIS — R51 Headache: Secondary | ICD-10-CM | POA: Diagnosis not present

## 2018-08-16 MED FILL — SIMVASTATIN 20 MG TABLET: 20 | 90 days supply | Qty: 90 | Fill #0

## 2018-08-16 MED FILL — LORazepam 0.5 MG TABS: 0.5 | 30 days supply | Qty: 30 | Fill #0

## 2018-08-17 DIAGNOSIS — H2513 Age-related nuclear cataract, bilateral: Secondary | ICD-10-CM | POA: Diagnosis not present

## 2018-08-17 LAB — CYTOLOGY - PAP
Diagnosis: NEGATIVE
HPV: NOT DETECTED

## 2018-09-02 MED FILL — SIMVASTATIN 20 MG TABLET: 20 | 90 days supply | Qty: 90 | Fill #0

## 2018-09-02 MED FILL — LORazepam 0.5 MG TABS: 0.5 | 30 days supply | Qty: 30 | Fill #0

## 2018-09-20 ENCOUNTER — Other Ambulatory Visit: Payer: Self-pay | Admitting: Family Medicine

## 2018-09-20 DIAGNOSIS — Z1231 Encounter for screening mammogram for malignant neoplasm of breast: Secondary | ICD-10-CM

## 2018-11-06 ENCOUNTER — Ambulatory Visit
Admission: RE | Admit: 2018-11-06 | Discharge: 2018-11-06 | Disposition: A | Discharge status: Home / Self Care | Payer: BC Managed Care – PPO | Source: Ambulatory Visit | Attending: Family Medicine | Admitting: Family Medicine

## 2018-11-06 ENCOUNTER — Other Ambulatory Visit: Payer: Self-pay

## 2018-11-06 DIAGNOSIS — Z1231 Encounter for screening mammogram for malignant neoplasm of breast: Secondary | ICD-10-CM | POA: Diagnosis not present

## 2018-12-26 MED FILL — SIMVASTATIN 20 MG TABLET: 20 | 90 days supply | Qty: 90 | Fill #1

## 2019-02-05 DIAGNOSIS — M9902 Segmental and somatic dysfunction of thoracic region: Secondary | ICD-10-CM | POA: Diagnosis not present

## 2019-02-05 DIAGNOSIS — M62830 Muscle spasm of back: Secondary | ICD-10-CM | POA: Diagnosis not present

## 2019-02-05 DIAGNOSIS — M791 Myalgia, unspecified site: Secondary | ICD-10-CM | POA: Diagnosis not present

## 2019-02-05 DIAGNOSIS — M5414 Radiculopathy, thoracic region: Secondary | ICD-10-CM | POA: Diagnosis not present

## 2019-02-07 DIAGNOSIS — M9902 Segmental and somatic dysfunction of thoracic region: Secondary | ICD-10-CM | POA: Diagnosis not present

## 2019-02-07 DIAGNOSIS — M9903 Segmental and somatic dysfunction of lumbar region: Secondary | ICD-10-CM | POA: Diagnosis not present

## 2019-02-07 DIAGNOSIS — M5414 Radiculopathy, thoracic region: Secondary | ICD-10-CM | POA: Diagnosis not present

## 2019-02-07 DIAGNOSIS — M5386 Other specified dorsopathies, lumbar region: Secondary | ICD-10-CM | POA: Diagnosis not present

## 2019-02-08 DIAGNOSIS — M5414 Radiculopathy, thoracic region: Secondary | ICD-10-CM | POA: Diagnosis not present

## 2019-02-08 DIAGNOSIS — M5386 Other specified dorsopathies, lumbar region: Secondary | ICD-10-CM | POA: Diagnosis not present

## 2019-02-08 DIAGNOSIS — M9902 Segmental and somatic dysfunction of thoracic region: Secondary | ICD-10-CM | POA: Diagnosis not present

## 2019-02-08 DIAGNOSIS — M9903 Segmental and somatic dysfunction of lumbar region: Secondary | ICD-10-CM | POA: Diagnosis not present

## 2019-02-10 DIAGNOSIS — M5414 Radiculopathy, thoracic region: Secondary | ICD-10-CM | POA: Diagnosis not present

## 2019-02-10 DIAGNOSIS — M9902 Segmental and somatic dysfunction of thoracic region: Secondary | ICD-10-CM | POA: Diagnosis not present

## 2019-02-10 DIAGNOSIS — M9903 Segmental and somatic dysfunction of lumbar region: Secondary | ICD-10-CM | POA: Diagnosis not present

## 2019-02-10 DIAGNOSIS — M5386 Other specified dorsopathies, lumbar region: Secondary | ICD-10-CM | POA: Diagnosis not present

## 2019-02-12 DIAGNOSIS — M5386 Other specified dorsopathies, lumbar region: Secondary | ICD-10-CM | POA: Diagnosis not present

## 2019-02-12 DIAGNOSIS — M9903 Segmental and somatic dysfunction of lumbar region: Secondary | ICD-10-CM | POA: Diagnosis not present

## 2019-02-12 DIAGNOSIS — M5414 Radiculopathy, thoracic region: Secondary | ICD-10-CM | POA: Diagnosis not present

## 2019-02-12 DIAGNOSIS — M9902 Segmental and somatic dysfunction of thoracic region: Secondary | ICD-10-CM | POA: Diagnosis not present

## 2019-02-14 DIAGNOSIS — M5386 Other specified dorsopathies, lumbar region: Secondary | ICD-10-CM | POA: Diagnosis not present

## 2019-02-14 DIAGNOSIS — M5414 Radiculopathy, thoracic region: Secondary | ICD-10-CM | POA: Diagnosis not present

## 2019-02-14 DIAGNOSIS — M9903 Segmental and somatic dysfunction of lumbar region: Secondary | ICD-10-CM | POA: Diagnosis not present

## 2019-02-14 DIAGNOSIS — M9902 Segmental and somatic dysfunction of thoracic region: Secondary | ICD-10-CM | POA: Diagnosis not present

## 2019-02-22 DIAGNOSIS — M5414 Radiculopathy, thoracic region: Secondary | ICD-10-CM | POA: Diagnosis not present

## 2019-02-22 DIAGNOSIS — M9903 Segmental and somatic dysfunction of lumbar region: Secondary | ICD-10-CM | POA: Diagnosis not present

## 2019-02-22 DIAGNOSIS — M5386 Other specified dorsopathies, lumbar region: Secondary | ICD-10-CM | POA: Diagnosis not present

## 2019-02-22 DIAGNOSIS — M9902 Segmental and somatic dysfunction of thoracic region: Secondary | ICD-10-CM | POA: Diagnosis not present

## 2019-02-28 ENCOUNTER — Other Ambulatory Visit: Payer: Self-pay | Admitting: Family Medicine

## 2019-02-28 DIAGNOSIS — E2839 Other primary ovarian failure: Secondary | ICD-10-CM

## 2019-02-28 DIAGNOSIS — M8580 Other specified disorders of bone density and structure, unspecified site: Secondary | ICD-10-CM

## 2019-03-01 DIAGNOSIS — M9903 Segmental and somatic dysfunction of lumbar region: Secondary | ICD-10-CM | POA: Diagnosis not present

## 2019-03-01 DIAGNOSIS — M5386 Other specified dorsopathies, lumbar region: Secondary | ICD-10-CM | POA: Diagnosis not present

## 2019-03-01 DIAGNOSIS — M9902 Segmental and somatic dysfunction of thoracic region: Secondary | ICD-10-CM | POA: Diagnosis not present

## 2019-03-01 DIAGNOSIS — M5414 Radiculopathy, thoracic region: Secondary | ICD-10-CM | POA: Diagnosis not present

## 2019-03-12 DIAGNOSIS — M5414 Radiculopathy, thoracic region: Secondary | ICD-10-CM | POA: Diagnosis not present

## 2019-03-12 DIAGNOSIS — M5386 Other specified dorsopathies, lumbar region: Secondary | ICD-10-CM | POA: Diagnosis not present

## 2019-03-12 DIAGNOSIS — M9902 Segmental and somatic dysfunction of thoracic region: Secondary | ICD-10-CM | POA: Diagnosis not present

## 2019-03-12 DIAGNOSIS — M9903 Segmental and somatic dysfunction of lumbar region: Secondary | ICD-10-CM | POA: Diagnosis not present

## 2019-03-19 DIAGNOSIS — M9902 Segmental and somatic dysfunction of thoracic region: Secondary | ICD-10-CM | POA: Diagnosis not present

## 2019-03-19 DIAGNOSIS — M5414 Radiculopathy, thoracic region: Secondary | ICD-10-CM | POA: Diagnosis not present

## 2019-03-19 DIAGNOSIS — M5386 Other specified dorsopathies, lumbar region: Secondary | ICD-10-CM | POA: Diagnosis not present

## 2019-03-19 DIAGNOSIS — M9903 Segmental and somatic dysfunction of lumbar region: Secondary | ICD-10-CM | POA: Diagnosis not present

## 2019-03-28 ENCOUNTER — Ambulatory Visit (INDEPENDENT_AMBULATORY_CARE_PROVIDER_SITE_OTHER): Payer: BC Managed Care – PPO | Admitting: Cardiovascular Disease

## 2019-03-28 ENCOUNTER — Encounter: Payer: Self-pay | Admitting: Cardiovascular Disease

## 2019-03-28 ENCOUNTER — Other Ambulatory Visit: Payer: Self-pay

## 2019-03-28 DIAGNOSIS — E782 Mixed hyperlipidemia: Secondary | ICD-10-CM

## 2019-03-28 DIAGNOSIS — R0789 Other chest pain: Secondary | ICD-10-CM

## 2019-03-28 NOTE — Progress Notes (Signed)
03/28/2019 Erika Wilkins   [birth year]  [id number]  Primary Physician Carol Ada, MD Primary Cardiologist: Lorretta Harp MD Garret Reddish, [place], [place]  HPI:  Erika Wilkins is a 64 y.o.  Marland Kitchen thin appearing married Caucasian female mother of 2 children, grandmother of one grandchild who works in a Administrator, [employer] and was referred by Dr. Carol Ada for cardiovascular valuation because of atypical chest pain.    I last saw her in the office 01/19/2018.  Her only risk factors include treated hyperlipidemia and family history with a mother who died of a myocardial infarction age 46 and a sister who is had a an MI as well.  She is never had a heart attack or stroke.  She is otherwise asymptomatic.  She had 2 episodes of brief chest pain around Thanksgiving and none since.  She has been evaluated by Dr. Marlou Porch approximately 8 years ago for similar symptoms and had negative stress test at that time.  Since I saw her she did have a routine GXT performed 02/02/2018 that was normal.  She is had rare true twinges of noncardiac sounding chest pain since but otherwise has been stable.    Current Meds  Medication Sig  . LORazepam (ATIVAN) 0.5 MG tablet Take 0.5 mg by mouth daily as needed for anxiety (Takes 1/2 tablet prn).  . simvastatin (ZOCOR) 20 MG tablet Take 1 tablet (20 mg total) by mouth daily.     No Known Allergies  Social History   Socioeconomic History  . Marital status: Married    Spouse name: Not on file  . Number of children: Not on file  . Years of education: Not on file  . Highest education level: Not on file  Occupational History  . Not on file  Tobacco Use  . Smoking status: Never Smoker  . Smokeless tobacco: Never Used  Substance and Sexual Activity  . Alcohol use: No    Alcohol/week: 0.0 standard drinks  . Drug use: No  . Sexual activity: Not on file  Other Topics Concern  . Not on file  Social History Narrative   Married to [place], 2 boys Jonni Sanger and  Loa Socks).   College grad [employer].  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     Social Determinants of Health   Financial Resource Strain:   . Difficulty of Paying Living Expenses: Not on file  Food Insecurity:   . Worried About Charity fundraiser in the Last Year: Not on file  . Ran Out of Food in the Last Year: Not on file  Transportation Needs:   . Lack of Transportation (Medical): Not on file  . Lack of Transportation (Non-Medical): Not on file  Physical Activity:   . Days of Exercise per Week: Not on file  . Minutes of Exercise per Session: Not on file  Stress:   . Feeling of Stress : Not on file  Social Connections:   . Frequency of Communication with Friends and Family: Not on file  . Frequency of Social Gatherings with Friends and Family: Not on file  . Attends Religious Services: Not on file  . Active Member of Clubs or Organizations: Not on file  . Attends Archivist Meetings: Not on file  . Marital Status: Not on file  Intimate Partner Violence:   . Fear of Current or Ex-Partner: Not on file  . Emotionally Abused: Not on file  . Physically Abused: Not on file  . Sexually Abused: Not  on file     Review of Systems: General: negative for chills, fever, night sweats or weight changes.  Cardiovascular: negative for chest pain, dyspnea on exertion, edema, orthopnea, palpitations, paroxysmal nocturnal dyspnea or shortness of breath Dermatological: negative for rash Respiratory: negative for cough or wheezing Urologic: negative for hematuria Abdominal: negative for nausea, vomiting, diarrhea, bright red blood per rectum, melena, or hematemesis Neurologic: negative for visual changes, syncope, or dizziness All other systems reviewed and are otherwise negative except as noted above.    Blood pressure 122/78, pulse 78, temperature 98.8 F (37.1 C), height 5\' 7"  (1.702 m), weight 131 lb (59.4 kg), SpO2 99 %.  General appearance: alert and no distress Neck: no  adenopathy, no carotid bruit, no JVD, supple, symmetrical, trachea midline and thyroid not enlarged, symmetric, no tenderness/mass/nodules Lungs: clear to auscultation bilaterally Heart: regular rate and rhythm, S1, S2 normal, no murmur, click, rub or gallop Extremities: extremities normal, atraumatic, no cyanosis or edema Pulses: 2+ and symmetric Skin: Skin color, texture, turgor normal. No rashes or lesions Neurologic: Alert and oriented X 3, normal strength and tone. Normal symmetric reflexes. Normal coordination and gait  EKG sinus rhythm at 70 with nonspecific ST and T wave changes.  I personally reviewed this EKG.  ASSESSMENT AND PLAN:   Hyperlipidemia History of hyperlipidemia on simvastatin 20 mg a day with lipid liver profile performed 08/14/2018 revealing cholesterol 208, LDL 109 and HDL 79.  This is close to where she needs to be for primary prevention.  Atypical chest pain Patient atypical chest pain with a negative GXT performed 02/02/2018 with occasional twinges that do not sound anginal since that time.      Lorretta Harp MD FACP,FACC,FAHA, [facility] 03/28/2019 11:08 AM

## 2019-03-28 NOTE — Assessment & Plan Note (Signed)
Patient atypical chest pain with a negative GXT performed 02/02/2018 with occasional twinges that do not sound anginal since that time.

## 2019-03-28 NOTE — Assessment & Plan Note (Signed)
History of hyperlipidemia on simvastatin 20 mg a day with lipid liver profile performed 08/14/2018 revealing cholesterol 208, LDL 109 and HDL 79.  This is close to where she needs to be for primary prevention.

## 2019-03-28 NOTE — Patient Instructions (Signed)
Medication Instructions:  Your physician recommends that you continue on your current medications as directed. Please refer to the Current Medication list given to you today.  If you need a refill on your cardiac medications before your next appointment, please call your pharmacy.   Lab work: NONE  Testing/Procedures: NONE  Follow-Up: At [facility], you and your health needs are our priority.  As part of our continuing mission to provide you with exceptional heart care, we have created designated Provider Care Teams.  These Care Teams include your primary Cardiologist (physician) and Advanced Practice Providers (APPs -  Physician Assistants and Nurse Practitioners) who all work together to provide you with the care you need, when you need it. You may see Dr, Gwenlyn Found or one of the following Advanced Practice Providers on your designated Care Team:    Kerin Ransom, PA-C  [place], [place]  Coletta Memos, [place] Your physician wants you to follow-up: as needed

## 2019-03-29 MED FILL — SIMVASTATIN 20 MG TABLET: 20 | 90 days supply | Qty: 90 | Fill #0

## 2019-05-17 ENCOUNTER — Ambulatory Visit
Admission: RE | Admit: 2019-05-17 | Discharge: 2019-05-17 | Disposition: A | Discharge status: Home / Self Care | Payer: BC Managed Care – PPO | Source: Ambulatory Visit | Attending: Family Medicine | Admitting: Family Medicine

## 2019-05-17 ENCOUNTER — Other Ambulatory Visit: Payer: Self-pay

## 2019-05-17 DIAGNOSIS — E2839 Other primary ovarian failure: Secondary | ICD-10-CM

## 2019-05-17 DIAGNOSIS — M8580 Other specified disorders of bone density and structure, unspecified site: Secondary | ICD-10-CM

## 2019-05-17 DIAGNOSIS — Z780 Asymptomatic menopausal state: Secondary | ICD-10-CM

## 2019-05-17 DIAGNOSIS — M8589 Other specified disorders of bone density and structure, multiple sites: Secondary | ICD-10-CM | POA: Diagnosis not present

## 2019-06-28 ENCOUNTER — Ambulatory Visit: Payer: BC Managed Care – PPO | Attending: Internal Medicine

## 2019-06-28 DIAGNOSIS — Z23 Encounter for immunization: Secondary | ICD-10-CM

## 2019-06-28 NOTE — Progress Notes (Signed)
   Covid-19 Vaccination Clinic  Name:  Erika Wilkins    MRN: [id number] DOB: 1955/03/22  06/28/2019  [name] was observed post Covid-19 immunization for 15 minutes without incident. She was provided with Vaccine Information Sheet and instruction to access the V-Safe system.   [name] was instructed to call 911 with any severe reactions post vaccine: Marland Kitchen Difficulty breathing  . Swelling of face and throat  . A fast heartbeat  . A bad rash all over body  . Dizziness and weakness   Immunizations Administered    Name Date Dose VIS Date Route   Pfizer COVID-19 Vaccine 06/28/2019 12:24 PM 0.3 mL 03/21/2018 Intramuscular   Manufacturer: [place]   Lot: [id number]   [place]: [id number]

## 2019-06-29 MED FILL — SIMVASTATIN 20 MG TABLET: 20 | 90 days supply | Qty: 90 | Fill #1

## 2019-07-19 ENCOUNTER — Ambulatory Visit: Payer: Self-pay | Attending: Internal Medicine

## 2019-07-19 DIAGNOSIS — Z23 Encounter for immunization: Secondary | ICD-10-CM

## 2019-07-19 NOTE — Progress Notes (Signed)
   Covid-19 Vaccination Clinic  Name:  Erika Wilkins    MRN: [id number] DOB: November 24, 1955  07/19/2019  [name] was observed post Covid-19 immunization for 15 minutes without incident. She was provided with Vaccine Information Sheet and instruction to access the V-Safe system.   [name] was instructed to call 911 with any severe reactions post vaccine: Marland Kitchen Difficulty breathing  . Swelling of face and throat  . A fast heartbeat  . A bad rash all over body  . Dizziness and weakness   Immunizations Administered    Name Date Dose VIS Date Route   Pfizer COVID-19 Vaccine 07/19/2019  9:07 AM 0.3 mL 03/21/2018 Intramuscular   Manufacturer: [employer]   Lot: [id number]   [place]: [address]-4

## 2019-08-09 ENCOUNTER — Ambulatory Visit
Admission: RE | Admit: 2019-08-09 | Discharge: 2019-08-09 | Disposition: A | Discharge status: Home / Self Care | Payer: Medicare Other | Source: Ambulatory Visit | Attending: Family Medicine | Admitting: Family Medicine

## 2019-08-09 ENCOUNTER — Other Ambulatory Visit: Payer: Self-pay | Admitting: Family Medicine

## 2019-08-09 DIAGNOSIS — M549 Dorsalgia, unspecified: Secondary | ICD-10-CM

## 2019-08-09 DIAGNOSIS — M546 Pain in thoracic spine: Secondary | ICD-10-CM

## 2019-08-10 MED FILL — CYCLOBENZAPRINE HCL 5 MG TA: 5 | 7 days supply | Qty: 30 | Fill #0

## 2019-09-19 ENCOUNTER — Other Ambulatory Visit (HOSPITAL_COMMUNITY): Payer: Self-pay | Admitting: Family Medicine

## 2019-09-19 MED FILL — SIMVASTATIN 20 MG TABLET: 20 | 90 days supply | Qty: 90 | Fill #0

## 2019-09-19 MED FILL — LORazepam 0.5 MG TABS: 0.5 | 30 days supply | Qty: 30 | Fill #0

## 2019-11-06 ENCOUNTER — Other Ambulatory Visit: Payer: Self-pay | Admitting: Family Medicine

## 2019-11-06 DIAGNOSIS — Z1231 Encounter for screening mammogram for malignant neoplasm of breast: Secondary | ICD-10-CM

## 2019-11-30 ENCOUNTER — Other Ambulatory Visit: Payer: Self-pay

## 2019-11-30 ENCOUNTER — Ambulatory Visit
Admission: RE | Admit: 2019-11-30 | Discharge: 2019-11-30 | Disposition: A | Discharge status: Home / Self Care | Payer: Medicare Other | Source: Ambulatory Visit | Attending: Family Medicine | Admitting: Family Medicine

## 2019-11-30 DIAGNOSIS — Z1231 Encounter for screening mammogram for malignant neoplasm of breast: Secondary | ICD-10-CM

## 2019-12-28 MED FILL — SIMVASTATIN 20 MG TABLET: 20 | 90 days supply | Qty: 90 | Fill #1

## 2020-02-04 ENCOUNTER — Other Ambulatory Visit: Payer: Self-pay | Admitting: Gastroenterology

## 2020-02-04 DIAGNOSIS — R1010 Upper abdominal pain, unspecified: Secondary | ICD-10-CM

## 2020-02-04 DIAGNOSIS — R1013 Epigastric pain: Secondary | ICD-10-CM | POA: Diagnosis not present

## 2020-02-04 DIAGNOSIS — R110 Nausea: Secondary | ICD-10-CM | POA: Diagnosis not present

## 2020-02-05 DIAGNOSIS — R110 Nausea: Secondary | ICD-10-CM | POA: Diagnosis not present

## 2020-02-05 DIAGNOSIS — R1010 Upper abdominal pain, unspecified: Secondary | ICD-10-CM | POA: Diagnosis not present

## 2020-02-05 DIAGNOSIS — R1013 Epigastric pain: Secondary | ICD-10-CM | POA: Diagnosis not present

## 2020-02-13 ENCOUNTER — Ambulatory Visit
Admission: RE | Admit: 2020-02-13 | Discharge: 2020-02-13 | Disposition: A | Discharge status: Home / Self Care | Payer: Medicare Other | Source: Ambulatory Visit | Attending: Gastroenterology | Admitting: Gastroenterology

## 2020-02-13 DIAGNOSIS — R110 Nausea: Secondary | ICD-10-CM | POA: Diagnosis not present

## 2020-02-13 DIAGNOSIS — R1013 Epigastric pain: Secondary | ICD-10-CM

## 2020-02-13 DIAGNOSIS — R1010 Upper abdominal pain, unspecified: Secondary | ICD-10-CM

## 2020-02-13 DIAGNOSIS — I700 Atherosclerosis of aorta: Secondary | ICD-10-CM | POA: Diagnosis not present

## 2020-02-13 MED ORDER — IOPAMIDOL (ISOVUE-300) INJECTION 61%
100.0000 mL | Freq: Once | INTRAVENOUS | Status: AC | PRN
  Administered 2020-02-13: 100 mL via INTRAVENOUS

## 2020-02-18 ENCOUNTER — Other Ambulatory Visit: Payer: Medicare Other

## 2020-03-27 MED FILL — SIMVASTATIN 20 MG TABLET: 20 | 90 days supply | Qty: 90 | Fill #2

## 2020-06-03 DIAGNOSIS — H31002 Unspecified chorioretinal scars, left eye: Secondary | ICD-10-CM | POA: Diagnosis not present

## 2020-06-03 DIAGNOSIS — H35372 Puckering of macula, left eye: Secondary | ICD-10-CM | POA: Diagnosis not present

## 2020-06-13 ENCOUNTER — Other Ambulatory Visit (HOSPITAL_COMMUNITY): Payer: Self-pay

## 2020-06-13 DIAGNOSIS — E162 Hypoglycemia, unspecified: Secondary | ICD-10-CM | POA: Diagnosis not present

## 2020-06-13 DIAGNOSIS — R42 Dizziness and giddiness: Secondary | ICD-10-CM | POA: Diagnosis not present

## 2020-06-13 DIAGNOSIS — R531 Weakness: Secondary | ICD-10-CM | POA: Diagnosis not present

## 2020-06-13 MED ORDER — MECLIZINE HCL 25 MG PO TABS
25.0000 mg | ORAL_TABLET | Freq: Two times a day (BID) | ORAL | 0 refills | Status: DC
  Filled 2020-06-13: qty 10, 5d supply, fill #0

## 2020-06-13 MED ORDER — ONETOUCH DELICA PLUS LANCET33G MISC
0 refills | Status: AC
  Filled 2020-06-13: qty 100, 90d supply, fill #0

## 2020-06-13 MED ORDER — ONETOUCH VERIO IQ SYSTEM W/DEVICE KIT
PACK | 0 refills | Status: AC
  Filled 2020-06-13: qty 1, 30d supply, fill #0

## 2020-06-13 MED ORDER — GLUCOSE BLOOD VI STRP
ORAL_STRIP | 0 refills | Status: DC
Start: 2020-06-13 — End: 2021-05-20
  Filled 2020-06-13: qty 50, 50d supply, fill #0

## 2020-06-16 ENCOUNTER — Other Ambulatory Visit (HOSPITAL_COMMUNITY): Payer: Self-pay

## 2020-06-18 DIAGNOSIS — H35372 Puckering of macula, left eye: Secondary | ICD-10-CM | POA: Diagnosis not present

## 2020-06-25 ENCOUNTER — Other Ambulatory Visit (HOSPITAL_COMMUNITY): Payer: Self-pay

## 2020-06-25 DIAGNOSIS — H353221 Exudative age-related macular degeneration, left eye, with active choroidal neovascularization: Secondary | ICD-10-CM | POA: Diagnosis not present

## 2020-06-25 DIAGNOSIS — H43813 Vitreous degeneration, bilateral: Secondary | ICD-10-CM | POA: Diagnosis not present

## 2020-06-25 DIAGNOSIS — H353111 Nonexudative age-related macular degeneration, right eye, early dry stage: Secondary | ICD-10-CM | POA: Diagnosis not present

## 2020-06-25 MED ORDER — TOBRAMYCIN 0.3 % OP SOLN
OPHTHALMIC | 5 refills | Status: DC
  Filled 2020-06-25: qty 5, 3d supply, fill #0

## 2020-06-29 MED FILL — Simvastatin Tab 20 MG: ORAL | 90 days supply | Qty: 90 | Fill #0 | Status: AC

## 2020-06-30 ENCOUNTER — Other Ambulatory Visit (HOSPITAL_COMMUNITY): Payer: Self-pay

## 2020-07-24 DIAGNOSIS — H353111 Nonexudative age-related macular degeneration, right eye, early dry stage: Secondary | ICD-10-CM | POA: Diagnosis not present

## 2020-07-24 DIAGNOSIS — H353221 Exudative age-related macular degeneration, left eye, with active choroidal neovascularization: Secondary | ICD-10-CM | POA: Diagnosis not present

## 2020-07-24 DIAGNOSIS — H43813 Vitreous degeneration, bilateral: Secondary | ICD-10-CM | POA: Diagnosis not present

## 2020-07-30 DIAGNOSIS — R42 Dizziness and giddiness: Secondary | ICD-10-CM | POA: Diagnosis not present

## 2020-08-25 DIAGNOSIS — H353222 Exudative age-related macular degeneration, left eye, with inactive choroidal neovascularization: Secondary | ICD-10-CM | POA: Diagnosis not present

## 2020-08-25 DIAGNOSIS — H52203 Unspecified astigmatism, bilateral: Secondary | ICD-10-CM | POA: Diagnosis not present

## 2020-08-25 DIAGNOSIS — H524 Presbyopia: Secondary | ICD-10-CM | POA: Diagnosis not present

## 2020-09-05 DIAGNOSIS — H353221 Exudative age-related macular degeneration, left eye, with active choroidal neovascularization: Secondary | ICD-10-CM | POA: Diagnosis not present

## 2020-09-05 DIAGNOSIS — H353111 Nonexudative age-related macular degeneration, right eye, early dry stage: Secondary | ICD-10-CM | POA: Diagnosis not present

## 2020-09-08 DIAGNOSIS — Z1159 Encounter for screening for other viral diseases: Secondary | ICD-10-CM | POA: Diagnosis not present

## 2020-09-08 DIAGNOSIS — Z0000 Encounter for general adult medical examination without abnormal findings: Secondary | ICD-10-CM | POA: Diagnosis not present

## 2020-09-08 DIAGNOSIS — E7800 Pure hypercholesterolemia, unspecified: Secondary | ICD-10-CM | POA: Diagnosis not present

## 2020-09-29 ENCOUNTER — Other Ambulatory Visit (HOSPITAL_COMMUNITY): Payer: Self-pay

## 2020-09-30 ENCOUNTER — Other Ambulatory Visit (HOSPITAL_COMMUNITY): Payer: Self-pay

## 2020-09-30 MED ORDER — SIMVASTATIN 20 MG PO TABS
ORAL_TABLET | ORAL | 3 refills | Status: DC
  Filled 2020-09-30: qty 90, 90d supply, fill #0
  Filled 2020-12-28: qty 90, 90d supply, fill #1
  Filled 2021-03-30: qty 90, 90d supply, fill #2
  Filled 2021-06-29: qty 90, 90d supply, fill #3

## 2020-10-22 ENCOUNTER — Other Ambulatory Visit: Payer: Self-pay | Admitting: Family Medicine

## 2020-10-22 DIAGNOSIS — Z1231 Encounter for screening mammogram for malignant neoplasm of breast: Secondary | ICD-10-CM

## 2020-10-24 ENCOUNTER — Other Ambulatory Visit (HOSPITAL_BASED_OUTPATIENT_CLINIC_OR_DEPARTMENT_OTHER): Payer: Self-pay

## 2020-11-04 DIAGNOSIS — H43813 Vitreous degeneration, bilateral: Secondary | ICD-10-CM | POA: Diagnosis not present

## 2020-11-04 DIAGNOSIS — H353111 Nonexudative age-related macular degeneration, right eye, early dry stage: Secondary | ICD-10-CM | POA: Diagnosis not present

## 2020-11-04 DIAGNOSIS — H2513 Age-related nuclear cataract, bilateral: Secondary | ICD-10-CM | POA: Diagnosis not present

## 2020-11-04 DIAGNOSIS — H353221 Exudative age-related macular degeneration, left eye, with active choroidal neovascularization: Secondary | ICD-10-CM | POA: Diagnosis not present

## 2020-12-04 ENCOUNTER — Other Ambulatory Visit: Payer: Self-pay

## 2020-12-04 ENCOUNTER — Ambulatory Visit
Admission: RE | Admit: 2020-12-04 | Discharge: 2020-12-04 | Disposition: A | Discharge status: Home / Self Care | Payer: Medicare Other | Source: Ambulatory Visit | Attending: Family Medicine | Admitting: Family Medicine

## 2020-12-04 DIAGNOSIS — Z1231 Encounter for screening mammogram for malignant neoplasm of breast: Secondary | ICD-10-CM | POA: Diagnosis not present

## 2020-12-29 ENCOUNTER — Other Ambulatory Visit (HOSPITAL_COMMUNITY): Payer: Self-pay

## 2021-01-22 DIAGNOSIS — R0981 Nasal congestion: Secondary | ICD-10-CM | POA: Diagnosis not present

## 2021-01-22 DIAGNOSIS — J029 Acute pharyngitis, unspecified: Secondary | ICD-10-CM | POA: Diagnosis not present

## 2021-01-22 DIAGNOSIS — J069 Acute upper respiratory infection, unspecified: Secondary | ICD-10-CM | POA: Diagnosis not present

## 2021-01-22 DIAGNOSIS — H9202 Otalgia, left ear: Secondary | ICD-10-CM | POA: Diagnosis not present

## 2021-01-24 DIAGNOSIS — H6502 Acute serous otitis media, left ear: Secondary | ICD-10-CM | POA: Diagnosis not present

## 2021-01-24 DIAGNOSIS — J069 Acute upper respiratory infection, unspecified: Secondary | ICD-10-CM | POA: Diagnosis not present

## 2021-01-27 DIAGNOSIS — H353111 Nonexudative age-related macular degeneration, right eye, early dry stage: Secondary | ICD-10-CM | POA: Diagnosis not present

## 2021-01-27 DIAGNOSIS — H2513 Age-related nuclear cataract, bilateral: Secondary | ICD-10-CM | POA: Diagnosis not present

## 2021-01-27 DIAGNOSIS — H353221 Exudative age-related macular degeneration, left eye, with active choroidal neovascularization: Secondary | ICD-10-CM | POA: Diagnosis not present

## 2021-01-27 DIAGNOSIS — H43813 Vitreous degeneration, bilateral: Secondary | ICD-10-CM | POA: Diagnosis not present

## 2021-02-11 ENCOUNTER — Other Ambulatory Visit (HOSPITAL_COMMUNITY): Payer: Self-pay

## 2021-02-11 MED ORDER — LORAZEPAM 0.5 MG PO TABS
ORAL_TABLET | ORAL | 0 refills | Status: DC
Start: 2021-02-11 — End: 2022-09-22
  Filled 2021-02-11: qty 30, 30d supply, fill #0

## 2021-03-05 ENCOUNTER — Other Ambulatory Visit (HOSPITAL_COMMUNITY): Payer: Self-pay

## 2021-03-05 DIAGNOSIS — R051 Acute cough: Secondary | ICD-10-CM | POA: Diagnosis not present

## 2021-03-05 MED ORDER — DOXYCYCLINE HYCLATE 100 MG PO TABS
ORAL_TABLET | ORAL | 0 refills | Status: DC
  Filled 2021-03-05: qty 20, 10d supply, fill #0

## 2021-03-05 MED ORDER — BENZONATATE 100 MG PO CAPS
ORAL_CAPSULE | ORAL | 0 refills | Status: DC
  Filled 2021-03-05: qty 5, 5d supply, fill #0

## 2021-03-30 ENCOUNTER — Other Ambulatory Visit (HOSPITAL_COMMUNITY): Payer: Self-pay

## 2021-04-10 ENCOUNTER — Other Ambulatory Visit (HOSPITAL_COMMUNITY): Payer: Self-pay

## 2021-04-22 IMAGING — CT CT ABD-PELV W/ CM
2 of 5 series · 11 of 46 positions shown, 12 images · IV contrast (iopamidol)
Comparison: CT abdomen pelvis October 23, 2008

CLINICAL DATA: Epigastric pain, nausea and upper abdomen pain.

EXAM:
CT ABDOMEN AND PELVIS WITH CONTRAST
TECHNIQUE: Multidetector CT imaging of the abdomen and pelvis was performed
using the standard protocol following bolus administration of
intravenous contrast.
CONTRAST:  100mL NKFOOR-PAA IOPAMIDOL (NKFOOR-PAA) INJECTION 61%

[Series 2: abd pelvis 5.00 br40 s3 axial · axial · 0.48mm/px · z∈[+1225,+1545]mm · 8 of 84 slices shown, 9 images]
[im 10/84  soft-tissue]
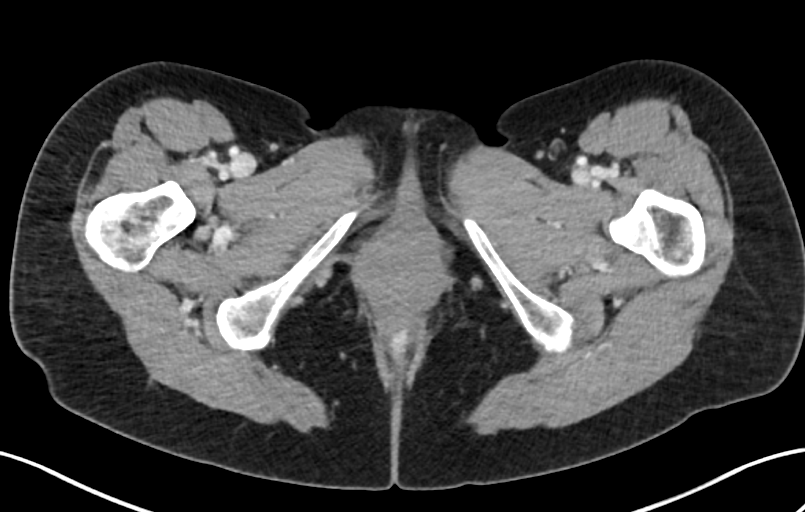
[im 10/84  bone]
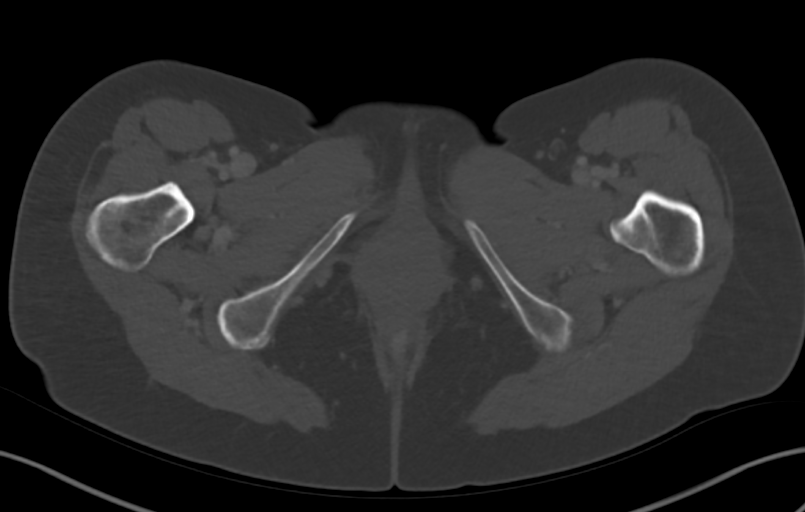
[im 19/84  soft-tissue]
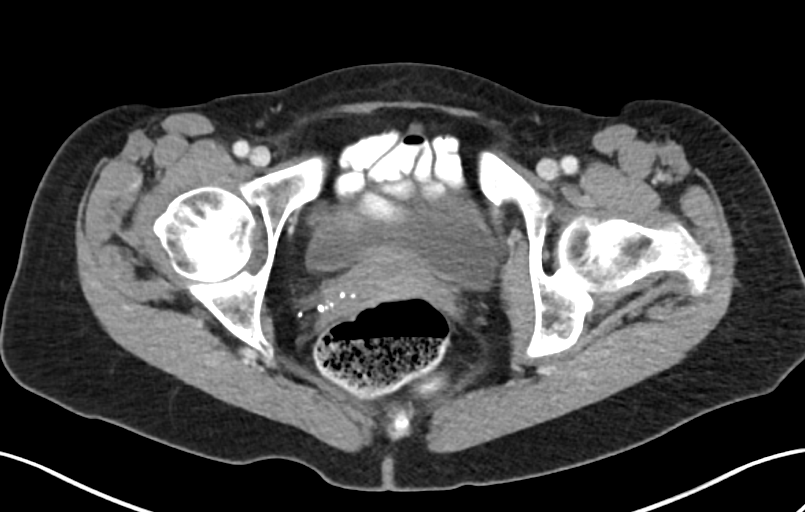
[im 28/84  soft-tissue]
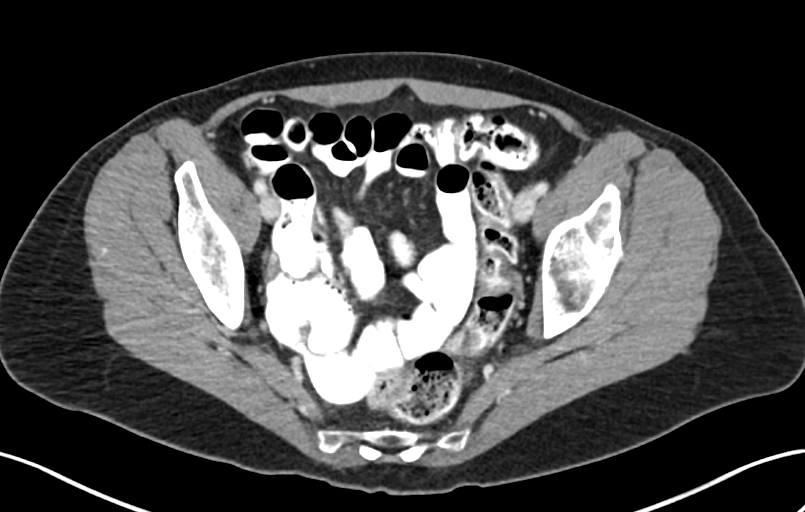
[im 37/84  soft-tissue]
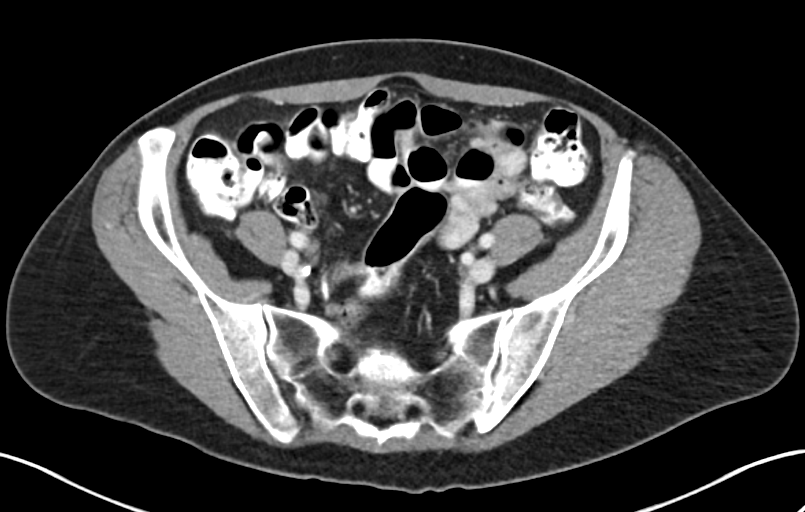
[im 47/84  soft-tissue]
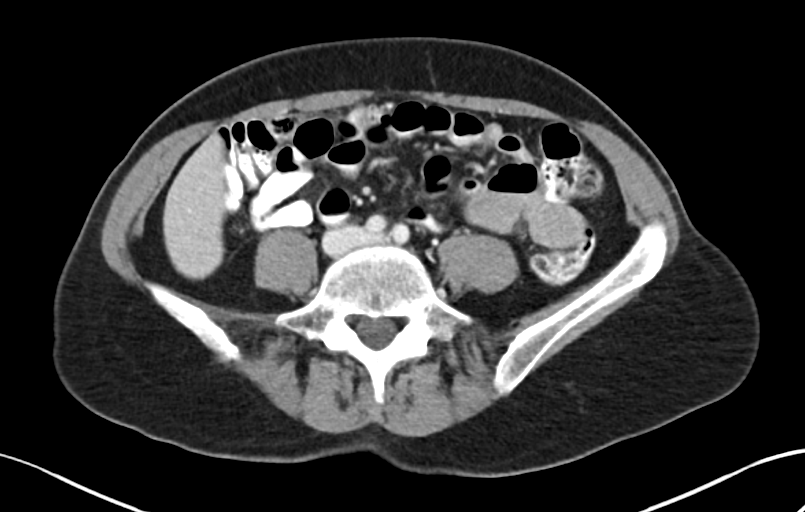
[im 56/84  soft-tissue]
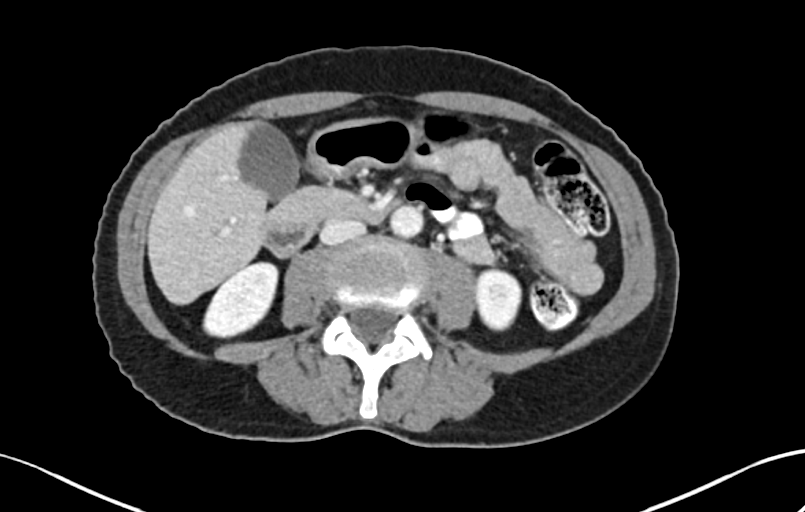
[im 65/84  soft-tissue]
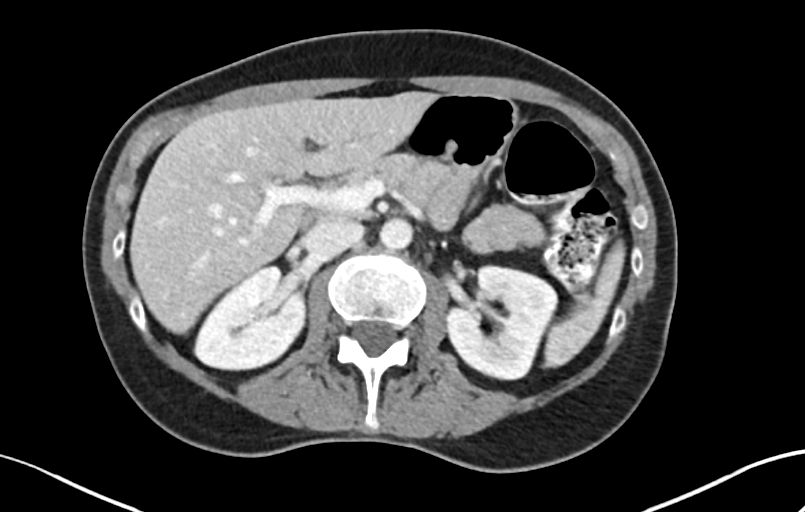
[im 74/84  soft-tissue]
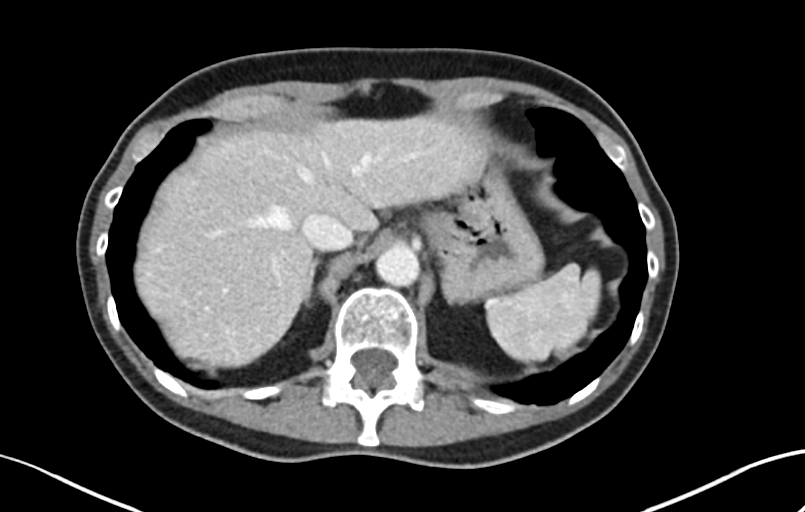

[Series 6: abd pelvis 2.00 br40 s3 cor · coronal · 0.76mm/px · 3 of 122 slices shown]
[im 41/122  soft-tissue]
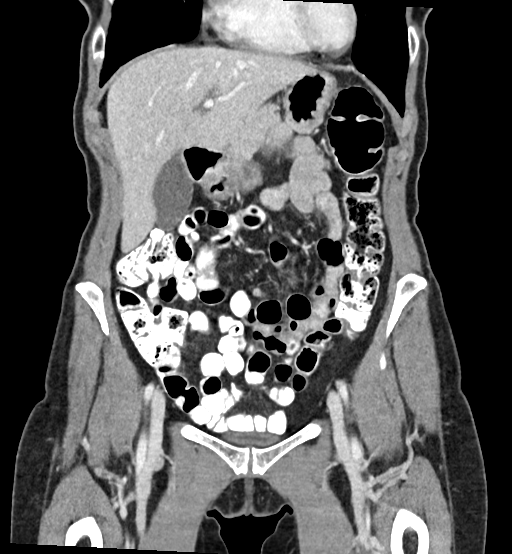
[im 54/122  soft-tissue]
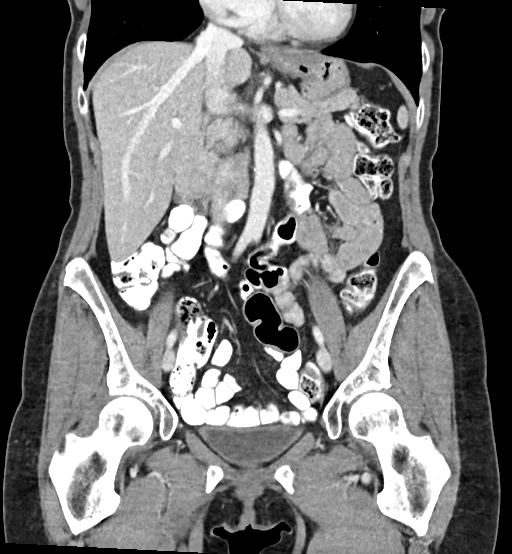
[im 68/122  soft-tissue]
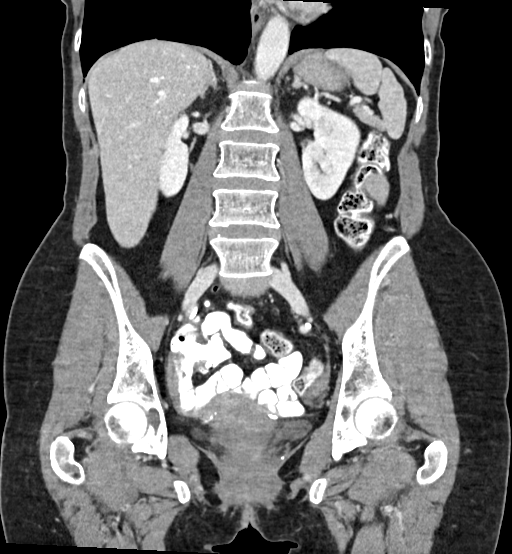

[11 of 46 positions shown; findings below may reference images not displayed]

FINDINGS: Lower chest: No acute abnormality.

Hepatobiliary: No focal liver abnormality is seen. No gallstones,
gallbladder wall thickening, or biliary dilatation.

Pancreas: Unremarkable. No pancreatic ductal dilatation or
surrounding inflammatory changes.

Spleen: Normal in size without focal abnormality.

Adrenals/Urinary Tract: Adrenal glands are unremarkable. Kidneys are
normal, without renal calculi, focal lesion, or hydronephrosis.
Bladder is unremarkable.

Stomach/Bowel: Radiopaque enteric contrast visualized to the level
of the rectum. Stomach is within normal limits. Appendix appears
normal. No evidence of bowel wall thickening, distention, or
inflammatory changes.

Vascular/Lymphatic: Aortic atherosclerosis. No enlarged abdominal or
pelvic lymph nodes.

Reproductive: Uterus and bilateral adnexa are unremarkable.

Other: No abdominopelvic ascites or pneumoperitoneum.

Musculoskeletal: No acute or significant osseous findings.
IMPRESSION: 1. No acute abdominopelvic findings.
2. Aortic atherosclerosis.

Aortic Atherosclerosis ([id number].B).

## 2021-04-28 DIAGNOSIS — H43813 Vitreous degeneration, bilateral: Secondary | ICD-10-CM | POA: Diagnosis not present

## 2021-04-28 DIAGNOSIS — H353111 Nonexudative age-related macular degeneration, right eye, early dry stage: Secondary | ICD-10-CM | POA: Diagnosis not present

## 2021-04-28 DIAGNOSIS — H2513 Age-related nuclear cataract, bilateral: Secondary | ICD-10-CM | POA: Diagnosis not present

## 2021-04-28 DIAGNOSIS — H353221 Exudative age-related macular degeneration, left eye, with active choroidal neovascularization: Secondary | ICD-10-CM | POA: Diagnosis not present

## 2021-05-05 ENCOUNTER — Other Ambulatory Visit (HOSPITAL_COMMUNITY): Payer: Self-pay

## 2021-05-07 ENCOUNTER — Other Ambulatory Visit (HOSPITAL_COMMUNITY): Payer: Self-pay

## 2021-05-13 ENCOUNTER — Other Ambulatory Visit: Payer: Self-pay | Admitting: Gastroenterology

## 2021-05-13 DIAGNOSIS — R110 Nausea: Secondary | ICD-10-CM | POA: Diagnosis not present

## 2021-05-13 DIAGNOSIS — R1310 Dysphagia, unspecified: Secondary | ICD-10-CM | POA: Diagnosis not present

## 2021-05-13 DIAGNOSIS — R1013 Epigastric pain: Secondary | ICD-10-CM

## 2021-05-14 ENCOUNTER — Ambulatory Visit
Admission: RE | Admit: 2021-05-14 | Discharge: 2021-05-14 | Disposition: A | Discharge status: Home / Self Care | Payer: Medicare Other | Source: Ambulatory Visit | Attending: Gastroenterology | Admitting: Gastroenterology

## 2021-05-14 DIAGNOSIS — R1013 Epigastric pain: Secondary | ICD-10-CM

## 2021-05-14 DIAGNOSIS — R110 Nausea: Secondary | ICD-10-CM

## 2021-05-19 ENCOUNTER — Other Ambulatory Visit (HOSPITAL_COMMUNITY): Payer: Self-pay

## 2021-05-20 ENCOUNTER — Other Ambulatory Visit (HOSPITAL_COMMUNITY): Payer: Self-pay

## 2021-05-20 MED ORDER — ONETOUCH VERIO VI STRP
ORAL_STRIP | 1 refills | Status: AC
  Filled 2021-05-20: qty 50, 50d supply, fill #0

## 2021-05-21 ENCOUNTER — Other Ambulatory Visit (HOSPITAL_COMMUNITY): Payer: Self-pay

## 2021-05-25 ENCOUNTER — Other Ambulatory Visit (HOSPITAL_COMMUNITY): Payer: Self-pay

## 2021-05-25 DIAGNOSIS — R1310 Dysphagia, unspecified: Secondary | ICD-10-CM | POA: Diagnosis not present

## 2021-05-25 DIAGNOSIS — R634 Abnormal weight loss: Secondary | ICD-10-CM | POA: Diagnosis not present

## 2021-05-25 DIAGNOSIS — K3189 Other diseases of stomach and duodenum: Secondary | ICD-10-CM | POA: Diagnosis not present

## 2021-05-25 DIAGNOSIS — K222 Esophageal obstruction: Secondary | ICD-10-CM | POA: Diagnosis not present

## 2021-05-25 DIAGNOSIS — K2289 Other specified disease of esophagus: Secondary | ICD-10-CM | POA: Diagnosis not present

## 2021-05-25 DIAGNOSIS — R1013 Epigastric pain: Secondary | ICD-10-CM | POA: Diagnosis not present

## 2021-05-25 DIAGNOSIS — K2930 Chronic superficial gastritis without bleeding: Secondary | ICD-10-CM | POA: Diagnosis not present

## 2021-05-25 DIAGNOSIS — K2980 Duodenitis without bleeding: Secondary | ICD-10-CM | POA: Diagnosis not present

## 2021-05-25 DIAGNOSIS — R110 Nausea: Secondary | ICD-10-CM | POA: Diagnosis not present

## 2021-05-25 MED ORDER — SUCRALFATE 1 G PO TABS
ORAL_TABLET | ORAL | 0 refills | Status: DC
Start: 2021-05-25 — End: 2022-09-22
  Filled 2021-05-25: qty 14, 7d supply, fill #0

## 2021-05-29 DIAGNOSIS — K2289 Other specified disease of esophagus: Secondary | ICD-10-CM | POA: Diagnosis not present

## 2021-05-29 DIAGNOSIS — K2980 Duodenitis without bleeding: Secondary | ICD-10-CM | POA: Diagnosis not present

## 2021-05-29 DIAGNOSIS — K2930 Chronic superficial gastritis without bleeding: Secondary | ICD-10-CM | POA: Diagnosis not present

## 2021-06-23 ENCOUNTER — Emergency Department (HOSPITAL_BASED_OUTPATIENT_CLINIC_OR_DEPARTMENT_OTHER): Payer: Medicare Other | Admitting: Radiology

## 2021-06-23 ENCOUNTER — Emergency Department (HOSPITAL_BASED_OUTPATIENT_CLINIC_OR_DEPARTMENT_OTHER)
Admission: EM | Admit: 2021-06-23 | Discharge: 2021-06-23 | Disposition: A | Discharge status: Home / Self Care | Payer: Medicare Other | Attending: Emergency Medicine | Admitting: Emergency Medicine

## 2021-06-23 ENCOUNTER — Encounter (HOSPITAL_BASED_OUTPATIENT_CLINIC_OR_DEPARTMENT_OTHER): Payer: Self-pay

## 2021-06-23 ENCOUNTER — Other Ambulatory Visit: Payer: Self-pay

## 2021-06-23 DIAGNOSIS — Z79899 Other long term (current) drug therapy: Secondary | ICD-10-CM | POA: Diagnosis not present

## 2021-06-23 DIAGNOSIS — R079 Chest pain, unspecified: Secondary | ICD-10-CM | POA: Diagnosis not present

## 2021-06-23 DIAGNOSIS — R0789 Other chest pain: Secondary | ICD-10-CM | POA: Diagnosis not present

## 2021-06-23 LAB — CBC
HCT: 45.4 % (ref 36.0–46.0)
Hemoglobin: 14.7 g/dL (ref 12.0–15.0)
MCH: 30.1 pg (ref 26.0–34.0)
MCHC: 32.4 g/dL (ref 30.0–36.0)
MCV: 93 fL (ref 80.0–100.0)
Platelets: 231 10*3/uL (ref 150–400)
RBC: 4.88 MIL/uL (ref 3.87–5.11)
RDW: 12.4 % (ref 11.5–15.5)
WBC: 6.1 10*3/uL (ref 4.0–10.5)
nRBC: 0 % (ref 0.0–0.2)

## 2021-06-23 LAB — BASIC METABOLIC PANEL
Anion gap: 11 (ref 5–15)
BUN: 17 mg/dL (ref 8–23)
CO2: 26 mmol/L (ref 22–32)
Calcium: 9.9 mg/dL (ref 8.9–10.3)
Chloride: 103 mmol/L (ref 98–111)
Creatinine, Ser: 0.76 mg/dL (ref 0.44–1.00)
GFR, Estimated: >60 mL/min (ref >60)
Glucose, Bld: 133 mg/dL — ABNORMAL HIGH (ref 70–99)
Potassium: 4.1 mmol/L (ref 3.5–5.1)
Sodium: 140 mmol/L (ref 135–145)

## 2021-06-23 LAB — TROPONIN I (HIGH SENSITIVITY)
Troponin I (High Sensitivity): <2 ng/L (ref <18)
Troponin I (High Sensitivity): <2 ng/L (ref <18)

## 2021-06-23 LAB — D-DIMER, QUANTITATIVE: D-Dimer, Quant: <0.27 ug/mL-FEU (ref 0.00–0.50)

## 2021-06-23 NOTE — ED Notes (Signed)
MD at the Bedside. 

## 2021-06-23 NOTE — ED Provider Notes (Signed)
[facility] EMERGENCY DEPT Provider Note   CSN: [id number] Arrival date & time: 06/23/21  1034     History  Chief Complaint  Patient presents with   Chest Pain    Erika Wilkins is a 66 y.o. female.  Pt is a 66 yo female with a pmhx significant for hyperlipidemia and anxiety.  Pt woke up this am and felt some left sided cp.  Pain was worse with deep inspiration.  She said it went away and came back a few times.  She took an ASA 325 mg at 0930.  No cp now.  No sob.  No n/v. No diaphoresis.      Home Medications Prior to Admission medications   Medication Sig Start Date End Date Taking? Authorizing Provider  benzonatate (TESSALON) 100 MG capsule Take 1 capsule by mouth at bedtime for 5 days. 03/05/21     Blood Glucose Monitoring Suppl (ONETOUCH VERIO IQ SYSTEM) w/Device KIT Use as directed 06/13/20     doxycycline (VIBRA-TABS) 100 MG tablet Take 1 tablet by mouth twice a day for 10 days 03/05/21     glucose blood (ONETOUCH VERIO) test strip Use to check blood sugar daily 05/20/21     Lancets (ONETOUCH DELICA PLUS [id number]) MISC Use to check blood sugar daily 06/13/20     LORazepam (ATIVAN) 0.5 MG tablet Take 0.5 mg by mouth daily as needed for anxiety (Takes 1/2 tablet prn).    [provider]  LORazepam (ATIVAN) 0.5 MG tablet Take 1/2 -1 tablet by mouth at bedtime as needed 02/11/21     meclizine (ANTIVERT) 25 MG tablet Take 1 tablet (25 mg total) by mouth 2 (two) times daily as needed for dizziness 06/13/20     simvastatin (ZOCOR) 20 MG tablet Take 1 tablet (20 mg total) by mouth daily. 02/14/18   Lorretta Harp, MD  simvastatin (ZOCOR) 20 MG tablet TAKE 1 TABLET BY MOUTH ONCE DAILY IN THE EVENING 09/19/19 09/28/20  Carol Ada, MD  simvastatin (ZOCOR) 20 MG tablet Take 1 tablet by mouth every evening. 09/30/20     sucralfate (CARAFATE) 1 g tablet Take 1 tablet by mouth twice daily on an empty stomach 05/25/21     tobramycin (TOBREX) 0.3 % ophthalmic solution  Instill 1 drop in left eye four times a day; Begin 1 day prior to procedure, use the day of procedure and 1 day after then stop 06/25/20         Allergies    Patient has no known allergies.    Review of Systems   Review of Systems  Cardiovascular:  Positive for chest pain.  All other systems reviewed and are negative.  Physical Exam Updated Vital Signs BP (!) 125/53   Pulse (!) 55   Temp 97.8 F (36.6 C)   Resp 20   Ht '5\' 7"'  (1.702 m)   Wt 58.1 kg   SpO2 100%   BMI 20.05 kg/m  Physical Exam Vitals and nursing note reviewed.  Constitutional:      Appearance: She is well-developed.  HENT:     Head: Normocephalic and atraumatic.  Eyes:     Extraocular Movements: Extraocular movements intact.     Pupils: Pupils are equal, round, and reactive to light.  Cardiovascular:     Rate and Rhythm: Normal rate and regular rhythm.     Heart sounds: Normal heart sounds.  Pulmonary:     Effort: Pulmonary effort is normal.     Breath sounds: Normal breath  sounds.  Abdominal:     General: Bowel sounds are normal.     Palpations: Abdomen is soft.  Musculoskeletal:        General: Normal range of motion.     Cervical back: Normal range of motion and neck supple.  Skin:    General: Skin is warm.     Capillary Refill: Capillary refill takes less than 2 seconds.  Neurological:     General: No focal deficit present.     Mental Status: She is alert and oriented to person, place, and time.  Psychiatric:        Mood and Affect: Mood normal.        Behavior: Behavior normal.    ED Results / Procedures / Treatments   Labs (all labs ordered are listed, but only abnormal results are displayed) Labs Reviewed  BASIC METABOLIC PANEL - Abnormal; Notable for the following components:      Result Value   Glucose, Bld 133 (*)    All other components within normal limits  CBC  D-DIMER, QUANTITATIVE  TROPONIN I (HIGH SENSITIVITY)  TROPONIN I (HIGH SENSITIVITY)    EKG EKG  Interpretation  Date/Time:  Tuesday Jun 23 2021 10:43:16 EDT Ventricular Rate:  60 PR Interval:  128 QRS Duration: 80 QT Interval:  404 QTC Calculation: 404 R Axis:   81 Text Interpretation: Normal sinus rhythm Nonspecific ST and T wave abnormality Abnormal ECG No significant change since last tracing Confirmed by Isla Pence [phone]) on 06/23/2021 11:00:55 AM  Radiology DG Chest 2 View  Result Date: 06/23/2021 CLINICAL DATA:  Chest pain EXAM: CHEST - 2 VIEW COMPARISON:  08/10/2008 FINDINGS: The heart size and mediastinal contours are within normal limits. Both lungs are clear. The visualized skeletal structures are unremarkable. IMPRESSION: No active cardiopulmonary disease. Electronically Signed   By: Franchot Gallo M.D.   On: 06/23/2021 11:11    Procedures Procedures    Medications Ordered in ED Medications - No data to display  ED Course/ Medical Decision Making/ A&P                           Medical Decision Making Amount and/or Complexity of Data Reviewed Labs: ordered. Radiology: ordered.   This patient presents to the ED for concern of chest pain, this involves an extensive number of treatment options, and is a complaint that carries with it a high risk of complications and morbidity.  The differential diagnosis includes CAD, pulm, gi, PE, pleuritic   Co morbidities that complicate the patient evaluation  High cholesterol   Additional history obtained:  Additional history obtained from epic chart review External records from outside source obtained and reviewed including husband   Lab Tests:  I Ordered, and personally interpreted labs.  The pertinent results include:  cbc nl, trop nl, bmp nl   Imaging Studies ordered:  I ordered imaging studies including CXR  I independently visualized and interpreted imaging which showed nl I agree with the radiologist interpretation   Cardiac Monitoring:  The patient was maintained on a cardiac monitor.  I  personally viewed and interpreted the cardiac monitored which showed an underlying rhythm of: nsr   Medicines ordered and prescription drug management:   I have reviewed the patients home medicines and have made adjustments as needed   Test Considered:  Ct chest, but nl ddimer   Critical Interventions:  ekg     Problem List / ED Course:  Atypical chest  pain:  Troponin, EKG nl.  NO CP since 0930.  HEART score 3. Ddimer neg.  Pt is stable for d/c.  She is to return if worse.  F/u with pcp.   Reevaluation:  After the interventions noted above, I reevaluated the patient and found that they have :improved   Social Determinants of Health:  Lives at home with husband   Dispostion:  After consideration of the diagnostic results and the patients response to treatment, I feel that the patent would benefit from discharge with outpatient f/u.          Final Clinical Impression(s) / ED Diagnoses Final diagnoses:  Atypical chest pain    Rx / DC Orders ED Discharge Orders     None         Isla Pence, MD 06/23/21 1345

## 2021-06-23 NOTE — ED Notes (Signed)
Reviewed AVS/discharge instruction with patient. Time allotted for and all questions answered. Patient is agreeable for d/c and escorted to ed exit by staff.  

## 2021-06-23 NOTE — ED Triage Notes (Signed)
Onset this am left side chest pain.  Couple of episodes. Pain worse with deep breathing.  But pain constant   SOB with pain.  Denies NV.  No pain at present.  Took ASA 325 mg

## 2021-06-29 ENCOUNTER — Other Ambulatory Visit (HOSPITAL_COMMUNITY): Payer: Self-pay

## 2021-07-22 DIAGNOSIS — H04222 Epiphora due to insufficient drainage, left lacrimal gland: Secondary | ICD-10-CM | POA: Diagnosis not present

## 2021-08-12 DIAGNOSIS — H43813 Vitreous degeneration, bilateral: Secondary | ICD-10-CM | POA: Diagnosis not present

## 2021-08-12 DIAGNOSIS — H353111 Nonexudative age-related macular degeneration, right eye, early dry stage: Secondary | ICD-10-CM | POA: Diagnosis not present

## 2021-08-12 DIAGNOSIS — H2513 Age-related nuclear cataract, bilateral: Secondary | ICD-10-CM | POA: Diagnosis not present

## 2021-08-12 DIAGNOSIS — H353221 Exudative age-related macular degeneration, left eye, with active choroidal neovascularization: Secondary | ICD-10-CM | POA: Diagnosis not present

## 2021-08-27 DIAGNOSIS — H04222 Epiphora due to insufficient drainage, left lacrimal gland: Secondary | ICD-10-CM | POA: Diagnosis not present

## 2021-08-27 DIAGNOSIS — H2513 Age-related nuclear cataract, bilateral: Secondary | ICD-10-CM | POA: Diagnosis not present

## 2021-08-27 DIAGNOSIS — H353222 Exudative age-related macular degeneration, left eye, with inactive choroidal neovascularization: Secondary | ICD-10-CM | POA: Diagnosis not present

## 2021-08-27 DIAGNOSIS — H5203 Hypermetropia, bilateral: Secondary | ICD-10-CM | POA: Diagnosis not present

## 2021-09-03 DIAGNOSIS — Z136 Encounter for screening for cardiovascular disorders: Secondary | ICD-10-CM | POA: Diagnosis not present

## 2021-09-03 DIAGNOSIS — Z0000 Encounter for general adult medical examination without abnormal findings: Secondary | ICD-10-CM | POA: Diagnosis not present

## 2021-09-10 DIAGNOSIS — Z0000 Encounter for general adult medical examination without abnormal findings: Secondary | ICD-10-CM | POA: Diagnosis not present

## 2021-09-10 DIAGNOSIS — I700 Atherosclerosis of aorta: Secondary | ICD-10-CM | POA: Diagnosis not present

## 2021-09-10 DIAGNOSIS — E7800 Pure hypercholesterolemia, unspecified: Secondary | ICD-10-CM | POA: Diagnosis not present

## 2021-09-11 ENCOUNTER — Other Ambulatory Visit (HOSPITAL_COMMUNITY): Payer: Self-pay

## 2021-09-11 MED ORDER — LORAZEPAM 0.5 MG PO TABS
ORAL_TABLET | ORAL | 0 refills | Status: AC
  Filled 2021-09-11: qty 30, 30d supply, fill #0

## 2021-09-29 ENCOUNTER — Other Ambulatory Visit (HOSPITAL_COMMUNITY): Payer: Self-pay

## 2021-10-01 ENCOUNTER — Other Ambulatory Visit (HOSPITAL_COMMUNITY): Payer: Self-pay

## 2021-10-01 MED ORDER — SIMVASTATIN 20 MG PO TABS
20.0000 mg | ORAL_TABLET | Freq: Every evening | ORAL | 3 refills | Status: DC
  Filled 2021-10-01: qty 90, 90d supply, fill #0
  Filled 2022-04-04: qty 90, 90d supply, fill #1
  Filled 2022-06-27: qty 90, 90d supply, fill #2

## 2021-11-09 ENCOUNTER — Other Ambulatory Visit: Payer: Self-pay | Admitting: Family Medicine

## 2021-11-09 DIAGNOSIS — Z1231 Encounter for screening mammogram for malignant neoplasm of breast: Secondary | ICD-10-CM

## 2021-11-17 ENCOUNTER — Other Ambulatory Visit (HOSPITAL_COMMUNITY): Payer: Self-pay

## 2021-11-17 DIAGNOSIS — R6889 Other general symptoms and signs: Secondary | ICD-10-CM | POA: Diagnosis not present

## 2021-11-17 DIAGNOSIS — D164 Benign neoplasm of bones of skull and face: Secondary | ICD-10-CM | POA: Diagnosis not present

## 2021-11-17 MED ORDER — PENICILLIN V POTASSIUM 500 MG PO TABS
500.0000 mg | ORAL_TABLET | Freq: Four times a day (QID) | ORAL | 0 refills | Status: DC
  Filled 2021-11-17: qty 56, 14d supply, fill #0

## 2021-11-17 MED ORDER — CHLORHEXIDINE GLUCONATE 0.12 % MT SOLN
15.0000 mL | Freq: Two times a day (BID) | OROMUCOSAL | 0 refills | Status: DC
  Filled 2021-11-17: qty 473, 16d supply, fill #0

## 2021-11-17 MED ORDER — IBUPROFEN 800 MG PO TABS
800.0000 mg | ORAL_TABLET | Freq: Four times a day (QID) | ORAL | 0 refills | Status: DC | PRN
  Filled 2021-11-17: qty 20, 5d supply, fill #0

## 2021-11-18 ENCOUNTER — Other Ambulatory Visit (HOSPITAL_COMMUNITY): Payer: Self-pay

## 2021-12-25 ENCOUNTER — Ambulatory Visit
Admission: RE | Admit: 2021-12-25 | Discharge: 2021-12-25 | Disposition: A | Discharge status: Home / Self Care | Payer: Medicare Other | Source: Ambulatory Visit | Attending: Family Medicine | Admitting: Family Medicine

## 2021-12-25 DIAGNOSIS — Z1231 Encounter for screening mammogram for malignant neoplasm of breast: Secondary | ICD-10-CM

## 2022-03-10 DIAGNOSIS — L578 Other skin changes due to chronic exposure to nonionizing radiation: Secondary | ICD-10-CM | POA: Diagnosis not present

## 2022-03-10 DIAGNOSIS — B360 Pityriasis versicolor: Secondary | ICD-10-CM | POA: Diagnosis not present

## 2022-03-10 DIAGNOSIS — Z1283 Encounter for screening for malignant neoplasm of skin: Secondary | ICD-10-CM | POA: Diagnosis not present

## 2022-03-10 DIAGNOSIS — D225 Melanocytic nevi of trunk: Secondary | ICD-10-CM | POA: Diagnosis not present

## 2022-03-15 ENCOUNTER — Other Ambulatory Visit: Payer: Self-pay | Admitting: Family Medicine

## 2022-03-15 DIAGNOSIS — Z1231 Encounter for screening mammogram for malignant neoplasm of breast: Secondary | ICD-10-CM

## 2022-03-18 ENCOUNTER — Other Ambulatory Visit (HOSPITAL_COMMUNITY): Payer: Self-pay

## 2022-03-18 MED ORDER — KETOCONAZOLE 2 % EX CREA
TOPICAL_CREAM | CUTANEOUS | 99 refills | Status: DC
  Filled 2022-03-18: qty 60, 30d supply, fill #0

## 2022-03-19 ENCOUNTER — Other Ambulatory Visit (HOSPITAL_COMMUNITY): Payer: Self-pay

## 2022-03-19 MED ORDER — TRETINOIN 0.025 % EX CREA
TOPICAL_CREAM | CUTANEOUS | 99 refills | Status: DC
  Filled 2022-03-19: qty 45, 30d supply, fill #0

## 2022-04-22 ENCOUNTER — Other Ambulatory Visit (HOSPITAL_COMMUNITY): Payer: Self-pay

## 2022-04-22 MED ORDER — ESCITALOPRAM OXALATE 5 MG PO TABS
5.0000 mg | ORAL_TABLET | Freq: Every day | ORAL | 1 refills | Status: DC
  Filled 2022-04-22: qty 30, 30d supply, fill #0

## 2022-04-23 ENCOUNTER — Other Ambulatory Visit (HOSPITAL_COMMUNITY): Payer: Self-pay

## 2022-04-26 ENCOUNTER — Other Ambulatory Visit (HOSPITAL_COMMUNITY): Payer: Self-pay

## 2022-05-24 ENCOUNTER — Other Ambulatory Visit (HOSPITAL_COMMUNITY): Payer: Self-pay

## 2022-05-24 DIAGNOSIS — R0789 Other chest pain: Secondary | ICD-10-CM | POA: Diagnosis not present

## 2022-05-24 DIAGNOSIS — E7800 Pure hypercholesterolemia, unspecified: Secondary | ICD-10-CM | POA: Diagnosis not present

## 2022-05-24 MED ORDER — ESCITALOPRAM OXALATE 5 MG PO TABS
2.5000 mg | ORAL_TABLET | Freq: Every day | ORAL | 1 refills | Status: DC
  Filled 2022-05-24: qty 90, 90d supply, fill #0
  Filled 2022-09-13: qty 90, 90d supply, fill #1

## 2022-05-26 ENCOUNTER — Other Ambulatory Visit (HOSPITAL_BASED_OUTPATIENT_CLINIC_OR_DEPARTMENT_OTHER): Payer: Self-pay | Admitting: Family Medicine

## 2022-05-26 DIAGNOSIS — E785 Hyperlipidemia, unspecified: Secondary | ICD-10-CM

## 2022-05-26 DIAGNOSIS — R0789 Other chest pain: Secondary | ICD-10-CM

## 2022-05-27 ENCOUNTER — Other Ambulatory Visit (HOSPITAL_COMMUNITY): Payer: Self-pay

## 2022-06-09 DIAGNOSIS — H353222 Exudative age-related macular degeneration, left eye, with inactive choroidal neovascularization: Secondary | ICD-10-CM | POA: Diagnosis not present

## 2022-06-09 DIAGNOSIS — H353111 Nonexudative age-related macular degeneration, right eye, early dry stage: Secondary | ICD-10-CM | POA: Diagnosis not present

## 2022-06-09 DIAGNOSIS — H43813 Vitreous degeneration, bilateral: Secondary | ICD-10-CM | POA: Diagnosis not present

## 2022-06-16 ENCOUNTER — Ambulatory Visit (HOSPITAL_COMMUNITY): Payer: Medicare Other

## 2022-06-30 ENCOUNTER — Ambulatory Visit (HOSPITAL_COMMUNITY)
Admission: RE | Admit: 2022-06-30 | Discharge: 2022-06-30 | Disposition: A | Discharge status: Home / Self Care | Payer: Medicare Other | Source: Ambulatory Visit | Attending: Family Medicine | Admitting: Family Medicine

## 2022-06-30 DIAGNOSIS — E785 Hyperlipidemia, unspecified: Secondary | ICD-10-CM | POA: Insufficient documentation

## 2022-06-30 DIAGNOSIS — R0789 Other chest pain: Secondary | ICD-10-CM

## 2022-07-16 DIAGNOSIS — H43813 Vitreous degeneration, bilateral: Secondary | ICD-10-CM | POA: Diagnosis not present

## 2022-07-16 DIAGNOSIS — H353111 Nonexudative age-related macular degeneration, right eye, early dry stage: Secondary | ICD-10-CM | POA: Diagnosis not present

## 2022-07-16 DIAGNOSIS — H353222 Exudative age-related macular degeneration, left eye, with inactive choroidal neovascularization: Secondary | ICD-10-CM | POA: Diagnosis not present

## 2022-08-18 DIAGNOSIS — H43813 Vitreous degeneration, bilateral: Secondary | ICD-10-CM | POA: Diagnosis not present

## 2022-08-18 DIAGNOSIS — H353222 Exudative age-related macular degeneration, left eye, with inactive choroidal neovascularization: Secondary | ICD-10-CM | POA: Diagnosis not present

## 2022-08-18 DIAGNOSIS — H353111 Nonexudative age-related macular degeneration, right eye, early dry stage: Secondary | ICD-10-CM | POA: Diagnosis not present

## 2022-09-17 ENCOUNTER — Other Ambulatory Visit (HOSPITAL_COMMUNITY): Payer: Self-pay

## 2022-09-22 ENCOUNTER — Encounter (HOSPITAL_BASED_OUTPATIENT_CLINIC_OR_DEPARTMENT_OTHER): Payer: Self-pay

## 2022-09-29 ENCOUNTER — Ambulatory Visit (HOSPITAL_BASED_OUTPATIENT_CLINIC_OR_DEPARTMENT_OTHER): Payer: Medicare Other | Admitting: Cardiovascular Disease

## 2022-09-29 ENCOUNTER — Other Ambulatory Visit (HOSPITAL_COMMUNITY): Payer: Self-pay

## 2022-09-29 ENCOUNTER — Encounter (HOSPITAL_BASED_OUTPATIENT_CLINIC_OR_DEPARTMENT_OTHER): Payer: Self-pay | Admitting: Cardiovascular Disease

## 2022-09-29 VITALS — BP 108/73 | HR 63 | Ht 67.0 in | Wt 124.0 lb

## 2022-09-29 DIAGNOSIS — Z5181 Encounter for therapeutic drug level monitoring: Secondary | ICD-10-CM | POA: Diagnosis not present

## 2022-09-29 DIAGNOSIS — E782 Mixed hyperlipidemia: Secondary | ICD-10-CM

## 2022-09-29 DIAGNOSIS — Z8249 Family history of ischemic heart disease and other diseases of the circulatory system: Secondary | ICD-10-CM | POA: Diagnosis not present

## 2022-09-29 DIAGNOSIS — R0609 Other forms of dyspnea: Secondary | ICD-10-CM | POA: Diagnosis not present

## 2022-09-29 DIAGNOSIS — Z01812 Encounter for preprocedural laboratory examination: Secondary | ICD-10-CM | POA: Diagnosis not present

## 2022-09-29 MED ORDER — ROSUVASTATIN CALCIUM 20 MG PO TABS
20.0000 mg | ORAL_TABLET | Freq: Every day | ORAL | 3 refills | Status: DC
  Filled 2022-09-29: qty 90, 90d supply, fill #0

## 2022-09-29 MED ORDER — METOPROLOL TARTRATE 25 MG PO TABS: ORAL_TABLET | ORAL | 0 refills | Status: DC

## 2022-09-29 MED ORDER — ROSUVASTATIN CALCIUM 20 MG PO TABS
20.0000 mg | ORAL_TABLET | Freq: Every day | ORAL | 3 refills | Status: DC
Start: 2022-09-29 — End: 2023-09-16

## 2022-09-29 MED ORDER — METOPROLOL TARTRATE 25 MG PO TABS
ORAL_TABLET | ORAL | 0 refills | Status: DC
  Filled 2022-09-29: qty 1, fill #0

## 2022-09-29 NOTE — Patient Instructions (Addendum)
Medication Instructions: STOP SIMVASTATIN   START ROSUVASTATIN 20 MG DAILY   TAKE METOPROLOL 25 MG 1 TABLET 2 HOURS PRIOR TO CT   *If you need a refill on your cardiac medications before your next appointment, please call your pharmacy*  Lab Work: BMET 1 WEEK PRIOR TO CARDIAC CT   FASTING LP/CMET/LPa IN 2-3 MONTHS  If you have labs (blood work) drawn today and your tests are completely normal, you will receive your results only by: MyChart Message (if you have MyChart) OR A paper copy in the mail If you have any lab test that is abnormal or we need to change your treatment, we will call you to review the results.  Testing/Procedures: Your physician has requested that you have cardiac CT. Cardiac computed tomography (CT) is a painless test that uses an x-ray machine to take clear, detailed pictures of your heart. For further information please visit [email or web address] Please follow instruction sheet as given.  Follow-Up: At [facility], you and your health needs are our priority.  As part of our continuing mission to provide you with exceptional heart care, we have created designated Provider Care Teams.  These Care Teams include your primary Cardiologist (physician) and Advanced Practice Providers (APPs -  Physician Assistants and Nurse Practitioners) who all work together to provide you with the care you need, when you need it.  We recommend signing up for the patient portal called "MyChart".  Sign up information is provided on this After Visit Summary.  MyChart is used to connect with patients for Virtual Visits (Telemedicine).  Patients are able to view lab/test results, encounter notes, upcoming appointments, etc.  Non-urgent messages can be sent to your provider as well.   To learn more about what you can do with MyChart, go to [email or web address].    Your next appointment:   12 month(s)  Provider:   Chilton Si, MD or Gillian Shields, NP    Other  Instructions   Your cardiac CT will be scheduled at one of the below locations:   [facility] [address] [place], [place] [address] [phone]  OR  [facility] [address] [place], [place] [address] [phone]  OR   [facility] [address] [place], [place] [address] [phone]  If scheduled at [facility], please arrive at the [facility] and Children's Entrance (Entrance C2) of [facility] 30 minutes prior to test start time. You can use the FREE valet parking offered at entrance C (encouraged to control the heart rate for the test)  Proceed to the [facility] Radiology Department (first floor) to check-in and test prep.  All radiology patients and guests should use entrance C2 at [facility], accessed from [place] [place], even though the hospital's physical address listed is [address]    If scheduled at [facility] or [facility], please arrive 15 mins early for check-in and test prep.  There is spacious parking and easy access to the radiology department from the [facility] Heart and Vascular entrance. Please enter here and check-in with the desk attendant.   Please follow these instructions carefully (unless otherwise directed):  An IV will be required for this test and Nitroglycerin will be given.  Hold all erectile dysfunction medications at least 3 days (72 hrs) prior to test. (Ie viagra, cialis, sildenafil, tadalafil, etc)   On the Night Before the Test: Be sure  to Drink plenty of water. Do not consume any caffeinated/decaffeinated beverages or chocolate 12 hours prior to your test. Do not take any antihistamines 12 hours prior to your test.  On the Day of the Test: Drink plenty of water until 1 hour prior to the test. Do not eat any food 1 hour prior to test. You may take your  regular medications prior to the test.  Take metoprolol (Lopressor) two hours prior to test. If you take Furosemide/Hydrochlorothiazide/Spironolactone, please HOLD on the morning of the test. FEMALES- please wear underwire-free bra if available, avoid dresses & tight clothing  After the Test: Drink plenty of water. After receiving IV contrast, you may experience a mild flushed feeling. This is normal. On occasion, you may experience a mild rash up to 24 hours after the test. This is not dangerous. If this occurs, you can take Benadryl 25 mg and increase your fluid intake. If you experience trouble breathing, this can be serious. If it is severe call 911 IMMEDIATELY. If it is mild, please call our office. If you take any of these medications: Glipizide/Metformin, Avandament, Glucavance, please do not take 48 hours after completing test unless otherwise instructed.  We will call to schedule your test 2-4 weeks out understanding that some insurance companies will need an authorization prior to the service being performed.   For more information and frequently asked questions, please visit our website : [email or web address]  For non-scheduling related questions, please contact the cardiac imaging nurse navigator should you have any questions/concerns: Cardiac Imaging Nurse Navigators Direct Office Dial: [phone]   For scheduling needs, including cancellations and rescheduling, please call [place], [phone].  Cardiac CT Angiogram A cardiac CT angiogram is a procedure to look at the heart and the area around the heart. It may be done to help find the cause of chest pains or other symptoms of heart disease. During this procedure, a substance called contrast dye is injected into a vein in the arm. The contrast highlights the blood vessels in the area to be checked. A large X-ray machine (CT scanner), then takes detailed pictures of the heart and the surrounding area. The  procedure is also sometimes called a coronary CT angiogram, coronary artery scanning, or CTA. A cardiac CT angiogram allows the health care provider to see how well blood is flowing to and from the heart. The provider will be able to see if there are any problems, such as: Blockage or narrowing of the arteries in the heart. Fluid around the heart. Signs of weakness or disease in the muscles, valves, and tissues of the heart. Tell a health care provider about: Any allergies you have. This is especially important if you have had a previous allergic reaction to medicines, contrast dye, or iodine. All medicines you are taking, including vitamins, herbs, eye drops, creams, and over-the-counter medicines. Any bleeding problems you have. Any surgeries you have had. Any medical conditions you have, including kidney problems or kidney failure. Whether you are pregnant or may be pregnant. Any anxiety disorders, chronic pain, or other conditions you have. These may increase your stress or prevent you from lying still. Any history of abnormal heart rhythms or heart procedures. What are the risks? Your provider will talk with you about risks. These may include: Bleeding. Infection. Allergic reactions to medicines or dyes. Damage to other structures or organs. Kidney damage from the contrast dye. Increased risk of cancer from radiation exposure. This risk is low. Talk with your provider about: The risks and  benefits of testing. How you can receive the lowest dose of radiation. What happens before the procedure? Wear comfortable clothing and remove any jewelry, glasses, dentures, and hearing aids. Follow instructions from your provider about eating and drinking. These may include: 12 hours before the procedure Avoid caffeine. This includes tea, coffee, soda, energy drinks, and diet pills. Drink plenty of water or other fluids that do not have caffeine in them. Being well hydrated can prevent  complications. 4-6 hours before the procedure Stop eating and drinking. This will reduce the risk of nausea from the contrast dye. Ask your provider about changing or stopping your regular medicines. These include: Diabetes medicines. Medicines to treat problems with erections (erectile dysfunction). If you have kidney problems, you may need to receive IV hydration before and after the test. What happens during the procedure?  Hair on your chest may need to be removed so that small sticky patches called electrodes can be placed on your chest. These will transmit information that helps to monitor your heart during the procedure. An IV will be inserted into one of your veins. You might be given a medicine to control your heart rate during the procedure. This will help to ensure that good images are obtained. You will be asked to lie on an exam table. This table will slide in and out of the CT machine during the procedure. Contrast dye will be injected into the IV. You might feel warm, or you may get a metallic taste in your mouth. You may be given medicines to relax or dilate the arteries in your heart. If you are allergic to contrast dyes or iodine you may be given medicine before the test to reduce the risk of an allergic reaction. The table that you are lying on will move into the CT machine tunnel for the scan. The person running the machine will give you instructions while the scans are being done. You may be asked to: Keep your arms above your head. Hold your breath for short periods. Stay very still, even if the table is moving. The procedure may vary among providers and hospitals. What can I expect after the procedure? After your procedure, it is common to have: A metallic taste in your mouth from the contrast dye. A feeling of warmth. A headache from the heart medicine. Follow these instructions at home: Take over-the-counter and prescription medicines only as told by your  provider. If you are told, drink enough fluid to keep your pee pale yellow. This will help to flush the contrast dye out of your body. Most people can return to their normal activities right after the procedure. Ask your provider what activities are safe for you. It is up to you to get the results of your procedure. Ask your provider, or the department that is doing the procedure, when your results will be ready. Contact a health care provider if: You have any symptoms of allergy to the contrast dye. These include: Shortness of breath. Rash or hives. A racing heartbeat. You notice a change in your peeing (urination). This information is not intended to replace advice given to you by your health care provider. Make sure you discuss any questions you have with your health care provider. Document Revised: 08/14/2021 Document Reviewed: 08/14/2021 Elsevier Patient Education  2024 [employer].

## 2022-09-29 NOTE — Progress Notes (Signed)
Cardiology Office Note:  .   Date:  09/29/2022  ID:  Erika Wilkins, DOB 05-19-1955, MRN [id number] PCP: Merri Brunette, MD  [facility] Providers Cardiologist:  None    History of Present Illness: .   Erika Wilkins is a 67 y.o. female with hyperlipidemia and family history of heart disease who is being seen today for the evaluation of elevated coronary calcium score.  At the request of Merri Brunette, MD. She previously saw Dr. Allyson Sabal in 2019 and 2021 for atypical chest pain.  GXT was negative for ischemia in 2020.  She was managed medically with a statin.  She had a coronary calcium score 06/2022 that revealed a score of 75.4, which was 97 percentile for age and gender.  Therefore she was referred to cardiology for further evaluation.   Ms. Haddow has a long-standing history of hyperlipidemia managed with simvastatin, presents for a consultation regarding her recent calcium score and family history of cardiac disease. She reports no current symptoms and overall feel well. However, she has experienced occasional chest pains in the past, which led to a consultation with a cardiologist a couple of years ago. The patient's mother passed away at the age of 53 due to a heart attack, which raises concern given her own elevated cholesterol levels.  Ms. Clementi has been on simvastatin for approximately 20 years, which she tolerates well. She maintains a relatively active lifestyle, recently joining a gym to lift weights, and engage in regular walking, albeit at a leisurely pace. She expresses a desire to increase her physical activity level but admits to some apprehension about pushing herself too hard due to her cardiac history.  Regarding her diet, the patient reports a preference for chicken and salmon, with occasional indulgence in sweets. She has lost some weight in the past year, which has been noticed by friends and healthcare providers. She expresses some confusion about dietary advice  received from different providers, particularly regarding the consumption of high-caloric foods.  The patient also mentions a sibling who passed away at the age of 21 due to an unclear cardiac event, which further compounds her concern about her own cardiac health. She expresses a desire for more information about her calcium score and any potential blockages, as well as any long-term implications of her simvastatin use.  ROS:  As per HPI.  Studies Reviewed: Marland Kitchen   EKG Interpretation Date/Time:  Wednesday September 29 2022 13:40:57 EDT Ventricular Rate:  63 PR Interval:  112 QRS Duration:  82 QT Interval:  400 QTC Calculation: 409 R Axis:   80  Text Interpretation: Normal sinus rhythm Possible Left atrial enlargement Nonspecific ST and T wave abnormality No significant change since last tracing Confirmed by Chilton Si ([id number]) on 09/29/2022 1:47:09 PM   Coronary CT-A 06/2022: IMPRESSION: Coronary calcium score of 75.4 Agatston units. This was 97th percentile for age-, race-, and sex-matched controls.   Risk Assessment/Calculations:             Physical Exam:   VS:  BP 108/73 (BP Location: Left Arm, Patient Position: Sitting, Cuff Size: Normal)   Pulse 63   Ht 5\' 7"  (1.702 m)   Wt 124 lb (56.2 kg)   BMI 19.42 kg/m  , BMI Body mass index is 19.42 kg/m. GENERAL:  Well appearing HEENT: Pupils equal round and reactive, fundi not visualized, oral mucosa unremarkable NECK:  No jugular venous distention, waveform within normal limits, carotid upstroke brisk and symmetric, no bruits, no thyromegaly LUNGS:  Clear to auscultation bilaterally HEART:  RRR.  PMI not displaced or sustained,S1 and S2 within normal limits, no S3, no S4, no clicks, no rubs, no murmurs ABD:  Flat, positive bowel sounds normal in frequency in pitch, no bruits, no rebound, no guarding, no midline pulsatile mass, no hepatomegaly, no splenomegaly EXT:  2 plus pulses throughout, no edema, no cyanosis no  clubbing SKIN:  No rashes no nodules NEURO:  Cranial nerves II through XII grossly intact, motor grossly intact throughout PSYCH:  Cognitively intact, oriented to person place and time   ASSESSMENT AND PLAN: .    # Coronary Artery Disease Calcium score revealed plaque in the left anterior descending artery and aorta. No symptoms of chest pain but she does have some shortness of breath with exertion. Family history of premature heart disease. -Schedule coronary CTA to assess for obstructive disease. -Encourage increased aerobic exercise.  # Hyperlipidemia LDL around 100, goal is less than 70 due to presence of plaque. Long-term use of Simvastatin 20mg . -Switch Simvastatin to Rosuvastatin 20mg  daily. -Check comprehensive metabolic panel and fasting lipid profile in late November/early December. -Check Lp(a)  # General Health Maintenance -Encourage healthy diet and regular exercise.  At least 150 minutes weekly.  -Follow-up in 1 year, or sooner if concerning results from coronary CTA or labs.       Dispo: f/u in 1 year  Signed, Chilton Si, MD

## 2022-10-05 ENCOUNTER — Telehealth (HOSPITAL_COMMUNITY): Payer: Self-pay | Admitting: *Deleted

## 2022-10-05 NOTE — Telephone Encounter (Signed)
Reaching out to patient to offer assistance regarding upcoming cardiac imaging study; pt verbalizes understanding of appt date/time, parking situation and where to check in, pre-test NPO status and medications ordered, and verified current allergies; name and call back number provided for further questions should they arise Hayley Sharpe RN Navigator Cardiac [facility] [phone] office [phone] cell  

## 2022-10-06 ENCOUNTER — Ambulatory Visit (HOSPITAL_COMMUNITY)
Admission: RE | Admit: 2022-10-06 | Discharge: 2022-10-06 | Disposition: A | Discharge status: Home / Self Care | Payer: Medicare Other | Source: Ambulatory Visit | Attending: Cardiovascular Disease | Admitting: Cardiovascular Disease

## 2022-10-06 DIAGNOSIS — I700 Atherosclerosis of aorta: Secondary | ICD-10-CM | POA: Diagnosis not present

## 2022-10-06 DIAGNOSIS — I2510 Atherosclerotic heart disease of native coronary artery without angina pectoris: Secondary | ICD-10-CM | POA: Insufficient documentation

## 2022-10-06 DIAGNOSIS — E782 Mixed hyperlipidemia: Secondary | ICD-10-CM | POA: Diagnosis not present

## 2022-10-06 DIAGNOSIS — Z8249 Family history of ischemic heart disease and other diseases of the circulatory system: Secondary | ICD-10-CM | POA: Insufficient documentation

## 2022-10-06 DIAGNOSIS — R0609 Other forms of dyspnea: Secondary | ICD-10-CM | POA: Insufficient documentation

## 2022-10-06 MED ORDER — NITROGLYCERIN 0.4 MG SL SUBL
SUBLINGUAL_TABLET | SUBLINGUAL | Status: AC
  Filled 2022-10-06: qty 2

## 2022-10-06 MED ORDER — IOHEXOL 350 MG/ML SOLN
95.0000 mL | Freq: Once | INTRAVENOUS | Status: AC | PRN
  Administered 2022-10-06: 95 mL via INTRAVENOUS

## 2022-10-06 MED ORDER — NITROGLYCERIN 0.4 MG SL SUBL
0.8000 mg | SUBLINGUAL_TABLET | SUBLINGUAL | Status: DC | PRN
  Administered 2022-10-06: 0.8 mg via SUBLINGUAL

## 2022-10-11 DIAGNOSIS — E7800 Pure hypercholesterolemia, unspecified: Secondary | ICD-10-CM | POA: Diagnosis not present

## 2022-10-11 LAB — LAB REPORT - SCANNED: EGFR: 86

## 2022-10-12 DIAGNOSIS — Z0000 Encounter for general adult medical examination without abnormal findings: Secondary | ICD-10-CM | POA: Diagnosis not present

## 2022-10-12 DIAGNOSIS — E7800 Pure hypercholesterolemia, unspecified: Secondary | ICD-10-CM | POA: Diagnosis not present

## 2022-10-12 DIAGNOSIS — I700 Atherosclerosis of aorta: Secondary | ICD-10-CM | POA: Diagnosis not present

## 2022-10-12 DIAGNOSIS — Z23 Encounter for immunization: Secondary | ICD-10-CM | POA: Diagnosis not present

## 2022-10-13 ENCOUNTER — Encounter (HOSPITAL_BASED_OUTPATIENT_CLINIC_OR_DEPARTMENT_OTHER): Payer: Self-pay | Admitting: Cardiovascular Disease

## 2022-10-24 ENCOUNTER — Encounter (HOSPITAL_BASED_OUTPATIENT_CLINIC_OR_DEPARTMENT_OTHER): Payer: Self-pay | Admitting: Cardiovascular Disease

## 2022-11-17 DIAGNOSIS — R0789 Other chest pain: Secondary | ICD-10-CM | POA: Diagnosis not present

## 2022-12-07 DIAGNOSIS — K648 Other hemorrhoids: Secondary | ICD-10-CM | POA: Diagnosis not present

## 2022-12-07 DIAGNOSIS — K621 Rectal polyp: Secondary | ICD-10-CM | POA: Diagnosis not present

## 2022-12-07 DIAGNOSIS — Z09 Encounter for follow-up examination after completed treatment for conditions other than malignant neoplasm: Secondary | ICD-10-CM | POA: Diagnosis not present

## 2022-12-07 DIAGNOSIS — Z860101 Personal history of adenomatous and serrated colon polyps: Secondary | ICD-10-CM | POA: Diagnosis not present

## 2022-12-09 DIAGNOSIS — K621 Rectal polyp: Secondary | ICD-10-CM | POA: Diagnosis not present

## 2022-12-27 ENCOUNTER — Ambulatory Visit
Admission: RE | Admit: 2022-12-27 | Discharge: 2022-12-27 | Disposition: A | Discharge status: Home / Self Care | Payer: Medicare Other | Source: Ambulatory Visit | Attending: Family Medicine | Admitting: Family Medicine

## 2022-12-27 DIAGNOSIS — Z1231 Encounter for screening mammogram for malignant neoplasm of breast: Secondary | ICD-10-CM | POA: Diagnosis not present

## 2022-12-30 ENCOUNTER — Other Ambulatory Visit: Payer: Self-pay | Admitting: Family Medicine

## 2022-12-30 DIAGNOSIS — R928 Other abnormal and inconclusive findings on diagnostic imaging of breast: Secondary | ICD-10-CM

## 2022-12-31 DIAGNOSIS — Z5181 Encounter for therapeutic drug level monitoring: Secondary | ICD-10-CM | POA: Diagnosis not present

## 2022-12-31 DIAGNOSIS — E782 Mixed hyperlipidemia: Secondary | ICD-10-CM | POA: Diagnosis not present

## 2023-01-02 LAB — COMPREHENSIVE METABOLIC PANEL
ALT: 24 [IU]/L (ref 0–32)
AST: 32 [IU]/L (ref 0–40)
Albumin: 4.6 g/dL (ref 3.9–4.9)
Alkaline Phosphatase: 70 [IU]/L (ref 44–121)
BUN/Creatinine Ratio: 21 (ref 12–28)
BUN: 15 mg/dL (ref 8–27)
Bilirubin Total: 0.8 mg/dL (ref 0.0–1.2)
CO2: 27 mmol/L (ref 20–29)
Calcium: 9.7 mg/dL (ref 8.7–10.3)
Chloride: 103 mmol/L (ref 96–106)
Creatinine, Ser: 0.73 mg/dL (ref 0.57–1.00)
Globulin, Total: 2.2 g/dL (ref 1.5–4.5)
Glucose: 87 mg/dL (ref 70–99)
Potassium: 4.3 mmol/L (ref 3.5–5.2)
Sodium: 143 mmol/L (ref 134–144)
Total Protein: 6.8 g/dL (ref 6.0–8.5)
eGFR: 90 mL/min/{1.73_m2} (ref >59)

## 2023-01-02 LAB — LIPID PANEL
Chol/HDL Ratio: 2.2 {ratio} (ref 0.0–4.4)
Cholesterol, Total: 193 mg/dL (ref 100–199)
HDL: 89 mg/dL (ref >39)
LDL Chol Calc (NIH): 86 mg/dL (ref 0–99)
Triglycerides: 104 mg/dL (ref 0–149)
VLDL Cholesterol Cal: 18 mg/dL (ref 5–40)

## 2023-01-02 LAB — LIPOPROTEIN A (LPA): Lipoprotein (a): <8.4 nmol/L (ref <75.0)

## 2023-01-08 ENCOUNTER — Ambulatory Visit: Payer: Medicare Other

## 2023-01-08 ENCOUNTER — Ambulatory Visit
Admission: RE | Admit: 2023-01-08 | Discharge: 2023-01-08 | Disposition: A | Discharge status: Home / Self Care | Payer: Medicare Other | Source: Ambulatory Visit | Attending: Family Medicine | Admitting: Family Medicine

## 2023-01-08 DIAGNOSIS — R928 Other abnormal and inconclusive findings on diagnostic imaging of breast: Secondary | ICD-10-CM

## 2023-02-25 DIAGNOSIS — H353111 Nonexudative age-related macular degeneration, right eye, early dry stage: Secondary | ICD-10-CM | POA: Diagnosis not present

## 2023-02-25 DIAGNOSIS — H353222 Exudative age-related macular degeneration, left eye, with inactive choroidal neovascularization: Secondary | ICD-10-CM | POA: Diagnosis not present

## 2023-02-25 DIAGNOSIS — H35361 Drusen (degenerative) of macula, right eye: Secondary | ICD-10-CM | POA: Diagnosis not present

## 2023-02-25 DIAGNOSIS — H43813 Vitreous degeneration, bilateral: Secondary | ICD-10-CM | POA: Diagnosis not present

## 2023-02-25 DIAGNOSIS — H43391 Other vitreous opacities, right eye: Secondary | ICD-10-CM | POA: Diagnosis not present

## 2023-04-27 DIAGNOSIS — H2513 Age-related nuclear cataract, bilateral: Secondary | ICD-10-CM | POA: Diagnosis not present

## 2023-04-27 DIAGNOSIS — H43812 Vitreous degeneration, left eye: Secondary | ICD-10-CM | POA: Diagnosis not present

## 2023-07-08 ENCOUNTER — Ambulatory Visit: Admitting: Podiatry

## 2023-07-08 ENCOUNTER — Ambulatory Visit (INDEPENDENT_AMBULATORY_CARE_PROVIDER_SITE_OTHER)

## 2023-07-08 DIAGNOSIS — M7751 Other enthesopathy of right foot: Secondary | ICD-10-CM

## 2023-07-08 DIAGNOSIS — B070 Plantar wart: Secondary | ICD-10-CM | POA: Diagnosis not present

## 2023-07-08 MED ORDER — MELOXICAM 7.5 MG PO TABS: 7.5000 mg | ORAL_TABLET | Freq: Every day | ORAL | 0 refills | Status: DC | PRN

## 2023-07-08 NOTE — Progress Notes (Unsigned)
 Subjective:   Patient ID: Erika Wilkins, female   DOB: 68 y.o.   MRN: [id number]   HPI Chief Complaint  Patient presents with   Plantar Warts    RM#12 Right foot possible wart symptoms started 3-4 weeks ago.No spot noticed just thickening of skin.Patient treated wart at home.   68 year old female presents the office today for above concerns.  She had a wart submetatarsal 5 that she been self treating and she was to have this checked but today most of her pain is in the ball of her foot.  This been ongoing for the last several weeks.  She did purchase new shoes, [employer] which has been helpful. No swelling.  She has tried ice as well as hot water soaks.   Review of Systems  All other systems reviewed and are negative.       Past Medical History:  Diagnosis Date   Anxiety    Headache    History of nuclear stress test    Normal nuclear stress test 07/14/2009 dr. Renna Cary   Hyperlipidemia    Hypoglycemia    Insomnia    Menopausal symptoms     No past surgical history on file.   Current Outpatient Medications:    meloxicam (MOBIC) 7.5 MG tablet, Take 1 tablet (7.5 mg total) by mouth daily as needed for pain., Disp: 30 tablet, Rfl: 0   Blood Glucose Monitoring Suppl (ONETOUCH VERIO IQ SYSTEM) w/Device KIT, Use as directed, Disp: 1 kit, Rfl: 0   escitalopram  (LEXAPRO ) 5 MG tablet, Take 0.5-1 tablets (2.5-5 mg total) by mouth daily., Disp: 90 tablet, Rfl: 1   glucose blood (ONETOUCH VERIO) test strip, Use to check blood sugar daily, Disp: 50 each, Rfl: 1   Lancets (ONETOUCH DELICA PLUS LANCET33G) MISC, Use to check blood sugar daily, Disp: 100 each, Rfl: 0   LORazepam  (ATIVAN ) 0.5 MG tablet, Take 1/2 to 1 tablet by mouth as needed at bedtime., Disp: 30 tablet, Rfl: 0   metoprolol  tartrate (LOPRESSOR ) 25 MG tablet, Take 1 tablet (25 mg) TWO hours prior to CT scan, Disp: 1 tablet, Rfl: 0   Multiple Vitamin (M.V.I. ADULT IV), MVI, Disp: , Rfl:    rosuvastatin  (CRESTOR ) 20 MG tablet,  Take 1 tablet (20 mg total) by mouth daily., Disp: 90 tablet, Rfl: 3  No Known Allergies    Objective:  Physical Exam  General: AAO x3, NAD  Dermatological: Callus noted right foot submetatarsal 5 there is new, healthy, pink skin present.  There is no definitive wart identified at this time.  No open lesions.  Vascular: Dorsalis Pedis artery and Posterior Tibial artery pedal pulses are 2/4 bilateral with immedate capillary fill time. There is no pain with calf compression, swelling, warmth, erythema.   Neruologic: Grossly intact via light touch bilateral.   Musculoskeletal: Tenderness is noted on the second MTPJ plantarly.  There is some slight edema there is no erythema.  There is no palpable neuroma.  There is no area pinpoint tenderness.  No instability noted at the MTPJ.  Gait: Unassisted, Nonantalgic.       Assessment:   68 year old female with capsulitis/metatarsalgia; plantar wart     Plan:  -Treatment options discussed including all alternatives, risks, and complications -Etiology of symptoms were discussed -X-rays were obtained and reviewed with the patient.  Arthritic changes present the first MTPJ.  No evidence of acute fracture noted. -Discussed steroid injection if needed -Prescribed mobic. Discussed side effects of the medication and directed to stop if  any are to occur and call the office.  -I added a Metatarsal pad to her insert. Consider custom inserts if needed -It  appears that the wart has resolved.  Continue to monitor.  She can continue with the topical acid or if it comes back we will add Cantharone.  Return if symptoms worsen or fail to improve.  Charity Conch DPM

## 2023-07-08 NOTE — Patient Instructions (Signed)
 Meloxicam Tablets What is this medication? MELOXICAM (mel OX i cam) treats mild to moderate pain, inflammation, or arthritis. It works by decreasing inflammation. It belongs to a group of medications called NSAIDs. This medicine may be used for other purposes; ask your health care provider or pharmacist if you have questions. COMMON BRAND NAME(S): Mobic What should I tell my care team before I take this medication? They need to know if you have any of these conditions: Asthma (lung or breathing disease) Bleeding disorder Coronary artery bypass graft (CABG) within the past 2 weeks Dehydration Frequently drink alcohol Heart attack Heart disease Heart failure High blood pressure Kidney disease Liver disease Stomach bleeding Stomach ulcers, other stomach or intestine problems Take medications that treat or prevent blood clots Taking other steroids, such as dexamethasone or prednisone Tobacco use An unusual or allergic reaction to meloxicam, other medications, foods, dyes, or preservatives Pregnant or trying to get pregnant Breast-feeding How should I use this medication? Take this medication by mouth. Take it as directed on the prescription label at the same time every day. You can take it with or without food. If it upsets your stomach, take it with food. Do not use it more often than directed. There may be unused or extra doses in the bottle after you finish your treatment. Talk to your care team if you have questions about your dose. A special MedGuide will be given to you by the pharmacist with each prescription and refill. Be sure to read this information carefully each time. Talk to your care team about the use of this medication in children. Special care may be needed. People over 69 years of age may have a stronger reaction and need a smaller dose. Overdosage: If you think you have taken too much of this medicine contact a poison control center or emergency room at once. NOTE:  This medicine is only for you. Do not share this medicine with others. What if I miss a dose? If you miss a dose, take it as soon as you can. If it is almost time for your next dose, take only that dose. Do not take double or extra doses. What may interact with this medication? Do not take this medication with any of the following: Cidofovir Ketorolac This medication may also interact with the following: Alcohol Aspirin and aspirin-like medications Certain medications for blood pressure, heart disease, irregular heart beat Certain medications for mental health conditions Certain medications that treat or prevent blood clots, such as warfarin, enoxaparin, dalteparin, apixaban, dabigatran, rivaroxaban Cyclosporine Diuretics Fluconazole Lithium Methotrexate Other NSAIDs, medications for pain and inflammation, such as ibuprofen  and naproxen Pemetrexed This list may not describe all possible interactions. Give your health care provider a list of all the medicines, herbs, non-prescription drugs, or dietary supplements you use. Also tell them if you smoke, drink alcohol, or use illegal drugs. Some items may interact with your medicine. What should I watch for while using this medication? Visit your care team for regular checks on your progress. Tell your care team if your symptoms do not start to get better or if they get worse. Do not take other medications that contain aspirin, ibuprofen , or naproxen with this medication. Side effects such as stomach upset, nausea, or ulcers may be more likely to occur. Many non-prescription medications contain aspirin, ibuprofen , or naproxen. Always read labels carefully. This medication can cause serious ulcers and bleeding in the stomach. It can happen with no warning. Tobacco, alcohol, older age, and poor health  can also increase risks. Call your care team right away if you have stomach pain or blood in your vomit or stool. This medication does not prevent a  heart attack or stroke. This medication may increase the chance of a heart attack or stroke. The chance may increase the longer you use this medication or if you have heart disease. If you take aspirin to prevent a heart attack or stroke, talk to your care team about using this medication. This medication may cause serious skin reactions. They can happen weeks to months after starting the medication. Contact your care team right away if you notice fevers or flu-like symptoms with a rash. The rash may be red or purple and then turn into blisters or peeling of the skin. Or, you might notice a red rash with swelling of the face, lips or lymph nodes in your neck or under your arms. Talk to your care team if you wish to become pregnant or think you might be pregnant. This medication can cause serious birth defects. This medication may affect your coordination, reaction time, or judgment. Do not drive or operate machinery until you know how this medication affects you. Sit up or stand slowly to reduce the risk of dizzy or fainting spells. Drinking alcohol with this medication can increase the risk of these side effects. Be careful brushing or flossing your teeth or using a toothpick because you may get an infection or bleed more easily. If you have any dental work done, tell your dentist you are receiving this medication. This medication may make it more difficult to get pregnant. Talk to your care team if you are concerned about your fertility. What side effects may I notice from receiving this medication? Side effects that you should report to your care team as soon as possible: Allergic reactions--skin rash, itching, hives, swelling of the face, lips, tongue, or throat Bleeding--bloody or black, tar-like stools, vomiting blood or brown material that looks like coffee grounds, red or dark brown urine, small red or purple spots on skin, unusual bruising or bleeding Heart attack--pain or tightness in the chest,  shoulders, arms, or jaw, nausea, shortness of breath, cold or clammy skin, feeling faint or lightheaded Heart failure--shortness of breath, swelling of the ankles, feet, or hands, sudden weight gain, unusual weakness or fatigue Increase in blood pressure Kidney injury--decrease in the amount of urine, swelling of the ankles, hands, or feet Liver injury--right upper belly pain, loss of appetite, nausea, light-colored stool, dark yellow or brown urine, yellowing skin or eyes, unusual weakness or fatigue Rash, fever, and swollen lymph nodes Redness, blistering, peeling, or loosening of the skin, including inside the mouth Round red or dark patches on the skin that may itch, burn, and blister Stroke--sudden numbness or weakness of the face, arm, or leg, trouble speaking, confusion, trouble walking, loss of balance or coordination, dizziness, severe headache, change in vision Side effects that usually do not require medical attention (report these to your care team if they continue or are bothersome): Headache Loss of appetite Nausea Upset stomach This list may not describe all possible side effects. Call your doctor for medical advice about side effects. You may report side effects to FDA at 1-800-FDA-1088. Where should I keep my medication? Keep out of the reach of children and pets. Store at room temperature between 20 and 25 degrees C (68 and 77 degrees F). Protect from moisture. Keep the container tightly closed. Get rid of any unused medication after the expiration date.  To get rid of medications that are no longer needed or have expired: Take the medication to a medication take-back program. Check with your pharmacy or law enforcement to find a location. If you cannot return the medication, check the label or package insert to see if the medication should be thrown out in the garbage or flushed down the toilet. If you are not sure, ask your care team. If it is safe to put it in the trash,  empty the medication out of the container. Mix the medication with cat litter, dirt, coffee grounds, or other unwanted substance. Seal the mixture in a bag or container. Put it in the trash. NOTE: This sheet is a summary. It may not cover all possible information. If you have questions about this medicine, talk to your doctor, pharmacist, or health care provider.  2025 Elsevier/Gold Standard (2023-01-14 00:00:00)

## 2023-07-17 DIAGNOSIS — J029 Acute pharyngitis, unspecified: Secondary | ICD-10-CM | POA: Diagnosis not present

## 2023-07-23 ENCOUNTER — Other Ambulatory Visit: Payer: Self-pay

## 2023-07-23 ENCOUNTER — Emergency Department (HOSPITAL_BASED_OUTPATIENT_CLINIC_OR_DEPARTMENT_OTHER)
Admission: EM | Admit: 2023-07-23 | Discharge: 2023-07-23 | Disposition: A | Discharge status: Home / Self Care | Attending: Emergency Medicine | Admitting: Emergency Medicine

## 2023-07-23 ENCOUNTER — Emergency Department (HOSPITAL_BASED_OUTPATIENT_CLINIC_OR_DEPARTMENT_OTHER)

## 2023-07-23 ENCOUNTER — Encounter (HOSPITAL_BASED_OUTPATIENT_CLINIC_OR_DEPARTMENT_OTHER): Payer: Self-pay

## 2023-07-23 DIAGNOSIS — M79651 Pain in right thigh: Secondary | ICD-10-CM | POA: Diagnosis not present

## 2023-07-23 DIAGNOSIS — M7989 Other specified soft tissue disorders: Secondary | ICD-10-CM | POA: Diagnosis not present

## 2023-07-23 DIAGNOSIS — J029 Acute pharyngitis, unspecified: Secondary | ICD-10-CM | POA: Diagnosis not present

## 2023-07-23 MED ORDER — CYCLOBENZAPRINE HCL 10 MG PO TABS: 10.0000 mg | ORAL_TABLET | Freq: Two times a day (BID) | ORAL | 0 refills | Status: DC | PRN

## 2023-07-23 MED ORDER — LIDOCAINE 5 % EX PTCH: 1.0000 | MEDICATED_PATCH | CUTANEOUS | 0 refills | Status: DC

## 2023-07-23 NOTE — ED Triage Notes (Signed)
 Right thigh pain onset 3 days ago. Denies injury. Was seen by [facility] walk-in clinic, sent to ED for ultrasound r/o DVT.

## 2023-07-23 NOTE — ED Provider Notes (Signed)
 [place] [facility] DEPARTMENT AT [facility] Provider Note   CSN: [id number] Arrival date & time: 07/23/23  1209     Patient presents with: Leg Pain   Erika Wilkins is a 68 y.o. female.   The history is provided by the patient, the spouse and medical records. No language interpreter was used.  Leg Pain Location:  Leg Time since incident:  4 days Leg location:  R upper leg Pain details:    Quality:  Cramping   Radiates to:  Does not radiate   Severity:  Moderate   Onset quality:  Gradual   Duration:  4 days   Timing:  Sporadic   Progression:  Waxing and waning Chronicity:  New Relieved by:  Nothing Worsened by:  Nothing Ineffective treatments:  None tried Associated symptoms: no back pain, no fatigue, no muscle weakness, no neck pain, no numbness, no stiffness, no swelling and no tingling        Prior to Admission medications   Medication Sig Start Date End Date Taking? Authorizing Provider  Blood Glucose Monitoring Suppl (ONETOUCH VERIO IQ SYSTEM) w/Device KIT Use as directed 06/13/20     escitalopram  (LEXAPRO ) 5 MG tablet Take 0.5-1 tablets (2.5-5 mg total) by mouth daily. 05/24/22     glucose blood (ONETOUCH VERIO) test strip Use to check blood sugar daily 05/20/21     Lancets (ONETOUCH DELICA PLUS LANCET33G) MISC Use to check blood sugar daily 06/13/20     LORazepam  (ATIVAN ) 0.5 MG tablet Take 1/2 to 1 tablet by mouth as needed at bedtime. 09/10/21     meloxicam  (MOBIC ) 7.5 MG tablet Take 1 tablet (7.5 mg total) by mouth daily as needed for pain. 07/08/23   Gershon Donnice SAUNDERS, DPM  metoprolol  tartrate (LOPRESSOR ) 25 MG tablet Take 1 tablet (25 mg) TWO hours prior to CT scan 09/29/22   Raford Riggs, MD  Multiple Vitamin (M.V.I. ADULT IV) MVI    [provider]  rosuvastatin  (CRESTOR ) 20 MG tablet Take 1 tablet (20 mg total) by mouth daily. 09/29/22 12/28/22  Raford Riggs, MD    Allergies: Patient has no known allergies.    Review of Systems   Constitutional:  Negative for chills and fatigue.  HENT:  Negative for congestion.   Respiratory:  Negative for cough, chest tightness and shortness of breath.   Cardiovascular:  Negative for chest pain, palpitations and leg swelling.  Gastrointestinal:  Negative for abdominal pain.  Musculoskeletal:  Negative for back pain, neck pain and stiffness.  Neurological:  Negative for weakness, numbness and headaches.  Psychiatric/Behavioral:  Negative for agitation.   All other systems reviewed and are negative.   Updated Vital Signs BP 127/79 (BP Location: Right Arm)   Pulse 77   Temp 98.1 F (36.7 C)   Resp 18   Ht 5' 7 (1.702 m)   Wt 59.4 kg   SpO2 100%   BMI 20.52 kg/m   Physical Exam Vitals and nursing note reviewed.  Constitutional:      General: She is not in acute distress.    Appearance: She is well-developed. She is not diaphoretic.  HENT:     Head: Normocephalic and atraumatic.     Right Ear: External ear normal.     Left Ear: External ear normal.     Nose: Nose normal.     Mouth/Throat:     Mouth: Mucous membranes are moist.   Eyes:     Conjunctiva/sclera: Conjunctivae normal.     Pupils: Pupils  are equal, round, and reactive to light.    Cardiovascular:     Rate and Rhythm: Normal rate.     Heart sounds: No murmur heard. Pulmonary:     Effort: No respiratory distress.     Breath sounds: No stridor. No rhonchi or rales.  Abdominal:     General: There is no distension.     Tenderness: There is no abdominal tenderness. There is no rebound.   Musculoskeletal:        General: No tenderness or signs of injury.     Cervical back: Normal range of motion and neck supple.     Right lower leg: No edema.     Left lower leg: No edema.   Skin:    General: Skin is warm.     Findings: No erythema or rash.   Neurological:     Mental Status: She is alert and oriented to person, place, and time.     Sensory: No sensory deficit.     Motor: No weakness or abnormal  muscle tone.     Deep Tendon Reflexes: Reflexes are normal and symmetric.     (all labs ordered are listed, but only abnormal results are displayed) Labs Reviewed - No data to display  EKG: None  Radiology: US  Venous Img Lower Unilateral Right Result Date: 07/23/2023 CLINICAL DATA:  Right anterior thigh pain. EXAM: RIGHT LOWER EXTREMITY VENOUS DOPPLER ULTRASOUND TECHNIQUE: Gray-scale sonography with graded compression, as well as color Doppler and duplex ultrasound were performed to evaluate the lower extremity deep venous systems from the level of the common femoral vein and including the common femoral, femoral, profunda femoral, popliteal and calf veins including the posterior tibial, peroneal and gastrocnemius veins when visible. The superficial great saphenous vein was also interrogated. Spectral Doppler was utilized to evaluate flow at rest and with distal augmentation maneuvers in the common femoral, femoral and popliteal veins. COMPARISON:  None Available. FINDINGS: Contralateral Common Femoral Vein: No evidence of thrombus. Normal compressibility. Common Femoral Vein: No evidence of thrombus. Normal compressibility, respiratory phasicity and response to augmentation. Saphenofemoral Junction: No evidence of thrombus. Normal compressibility and flow on color Doppler imaging. Profunda Femoral Vein: No evidence of thrombus. Normal compressibility and flow on color Doppler imaging. Femoral Vein: No evidence of thrombus. Normal compressibility, respiratory phasicity and response to augmentation. Popliteal Vein: No evidence of thrombus. Normal compressibility, respiratory phasicity and response to augmentation. Calf Veins: Visualized right deep calf veins are patent without thrombus. Superficial Great Saphenous Vein: No evidence of thrombus. Normal compressibility. Other Findings:  None. IMPRESSION: Negative for deep venous thrombosis in right lower extremity. Electronically Signed   By: Juliene Balder  M.D.   On: 07/23/2023 15:08     Procedures   Medications Ordered in the ED - No data to display                                  Medical Decision Making   Erika Wilkins is a 68 y.o. female with a past medical history significant for hyperlipidemia, anxiety, and insomnia who presents with right thigh pain.  According to patient, she has had pain that comes and goes in her right thigh for the last 4 days.  She reports it feels a cramping type pain that last between 12 and 20 seconds and comes and goes throughout the day.  She has not had difficulty sleeping due to the pain.  She went to urgent care and was told to come here to rule out blood clot.  She denies any trauma or any injuries but does report that she has been doing more exercises with her legs and also had recent podiatry appointments that instruct her to use different foot pads.  Unclear if this has changed her gait.  Otherwise she denies fevers, chills, constipation, diarrhea, or other changes.  Denies any leg swelling.  Denies any numbness or weakness.  Denies trauma.  Denies any ankle or hip pain itself.  Denies rashes to suggest shingles.  Denies any skin changes.  On exam, lungs clear chest nontender.  Back nontender.  Abdomen nontender.  Patient had no focal tenderness on my exam and has identical appearing legs without erythema, rash, or swelling.  Intact sensation strength and pulses in lower extremities.  Knees nontender.  Hips nontender.  Patient otherwise well-appearing.  Given description of symptoms I suspect he was having muscle spasm in the last for several seconds and then improved.  With that being only on the anterior side on the back have low suspicion for radicular type symptoms.  She denies numbness or weakness.  Denies any skin changes to suggest shingles developing.  No joint involvement of the pain, doubt deep infection.  We did discuss getting labs to look for electrolyte disturbance but she did not want  that at this time.  She had an ultrasound from triage that was negative for DVT.  Given her lack of focal trauma or constant pain we have low suspicion for bony abnormality and we agreed to hold on x-ray at this time.  Patient will be given a prescription for muscle relaxant and lido patches for likely muscle spasm and will follow-up with her PCP.  She agreed with plan of care and outpatient follow-up.  She does question or concerns and was discharged in good condition with medication prescriptions.            Final diagnoses:  Acute pain of right thigh    ED Discharge Orders          Ordered    cyclobenzaprine (FLEXERIL) 10 MG tablet  2 times daily PRN        07/23/23 1556    lidocaine (LIDODERM) 5 %  Every 24 hours        07/23/23 1556            Clinical Impression: 1. Acute pain of right thigh     Disposition: Discharge  Condition: Good  I have discussed the results, Dx and Tx plan with the pt(& family if present). He/she/they expressed understanding and agree(s) with the plan. Discharge instructions discussed at great length. Strict return precautions discussed and pt &/or family have verbalized understanding of the instructions. No further questions at time of discharge.    New Prescriptions   CYCLOBENZAPRINE (FLEXERIL) 10 MG TABLET    Take 1 tablet (10 mg total) by mouth 2 (two) times daily as needed for muscle spasms.   LIDOCAINE (LIDODERM) 5 %    Place 1 patch onto the skin daily. Remove & Discard patch within 12 hours or as directed by MD    Follow Up: Claudene Pellet, MD [address] [place] [address] [phone]     [facility] Emergency Department at [facility] [address] [place]  [id number] [phone]         Joram Venson, Lonni PARAS, MD 07/23/23 [phone]

## 2023-07-23 NOTE — Discharge Instructions (Signed)
 Your history, exam, and evaluation today seem consistent with muscle cramps and spasm that are waxing waning for the last few days.  The ultrasound that you were sent for was negative for blood clot and the rest of your exam was reassuring.  There was no trauma and we agreed together to hold on extensive workup with imaging or labs.  We recommend trying the medication to help with muscle spasm and muscle soreness and follow-up with your primary doctor.  If any symptoms change or worsen acutely, return to the nearest emergency department.

## 2023-08-30 DIAGNOSIS — H43391 Other vitreous opacities, right eye: Secondary | ICD-10-CM | POA: Diagnosis not present

## 2023-08-30 DIAGNOSIS — H43813 Vitreous degeneration, bilateral: Secondary | ICD-10-CM | POA: Diagnosis not present

## 2023-08-30 DIAGNOSIS — H35361 Drusen (degenerative) of macula, right eye: Secondary | ICD-10-CM | POA: Diagnosis not present

## 2023-08-30 DIAGNOSIS — H353111 Nonexudative age-related macular degeneration, right eye, early dry stage: Secondary | ICD-10-CM | POA: Diagnosis not present

## 2023-08-30 DIAGNOSIS — H353222 Exudative age-related macular degeneration, left eye, with inactive choroidal neovascularization: Secondary | ICD-10-CM | POA: Diagnosis not present

## 2023-09-13 ENCOUNTER — Other Ambulatory Visit (HOSPITAL_BASED_OUTPATIENT_CLINIC_OR_DEPARTMENT_OTHER): Payer: Self-pay | Admitting: Cardiovascular Disease

## 2023-10-03 DIAGNOSIS — R300 Dysuria: Secondary | ICD-10-CM | POA: Diagnosis not present

## 2023-10-03 DIAGNOSIS — N390 Urinary tract infection, site not specified: Secondary | ICD-10-CM | POA: Diagnosis not present

## 2023-10-11 ENCOUNTER — Other Ambulatory Visit: Payer: Self-pay | Admitting: Cardiovascular Disease

## 2023-10-11 MED ORDER — ROSUVASTATIN CALCIUM 20 MG PO TABS: 20.0000 mg | ORAL_TABLET | Freq: Every day | ORAL | 1 refills | Status: DC

## 2023-10-13 ENCOUNTER — Other Ambulatory Visit: Payer: Self-pay | Admitting: Cardiovascular Disease

## 2023-10-13 MED ORDER — ROSUVASTATIN CALCIUM 20 MG PO TABS: 20.0000 mg | ORAL_TABLET | Freq: Every day | ORAL | 0 refills | Status: DC

## 2023-11-01 DIAGNOSIS — E7800 Pure hypercholesterolemia, unspecified: Secondary | ICD-10-CM | POA: Diagnosis not present

## 2023-11-01 DIAGNOSIS — Z0000 Encounter for general adult medical examination without abnormal findings: Secondary | ICD-10-CM | POA: Diagnosis not present

## 2023-11-03 ENCOUNTER — Other Ambulatory Visit (HOSPITAL_BASED_OUTPATIENT_CLINIC_OR_DEPARTMENT_OTHER): Payer: Self-pay | Admitting: Family Medicine

## 2023-11-03 DIAGNOSIS — E2839 Other primary ovarian failure: Secondary | ICD-10-CM

## 2023-11-15 ENCOUNTER — Other Ambulatory Visit: Payer: Self-pay

## 2023-11-18 ENCOUNTER — Other Ambulatory Visit: Payer: Self-pay | Admitting: Family Medicine

## 2023-11-18 DIAGNOSIS — Z1231 Encounter for screening mammogram for malignant neoplasm of breast: Secondary | ICD-10-CM

## 2023-11-18 MED ORDER — ROSUVASTATIN CALCIUM 20 MG PO TABS: 20.0000 mg | ORAL_TABLET | Freq: Every day | ORAL | 0 refills | Status: DC

## 2023-12-07 ENCOUNTER — Telehealth: Payer: Self-pay | Admitting: Cardiovascular Disease

## 2023-12-07 NOTE — Telephone Encounter (Signed)
 Spoke with patient regarding Crestor   She has been having intermittent cramping/strange feelings in her legs, even went to ED for this in June. Saw PCP afterwards who recommended hydration and magnesium.  Has been 3 to 4 weeks since any issues  Also feels she is having muscle loss, but thinks could be age related as well.  Had labs checked at PCP few couple months ago, LDL 83 Has follow up in January.  Patient will keep a diary episodes and bring to follow up visit She will also bring labs Advised if worsening in symptoms prior to follow up to call back

## 2023-12-07 NOTE — Telephone Encounter (Signed)
 Pt c/o medication issue:  1. Name of Medication: rosuvastatin  (CRESTOR ) 20 MG tablet   2. How are you currently taking this medication (dosage and times per day)? As written  3. Are you having a reaction (difficulty breathing--STAT)? No  4. What is your medication issue? Pt states this medication has been causing leg pain and would like to reduce the dosage.

## 2023-12-28 ENCOUNTER — Ambulatory Visit
Admission: RE | Admit: 2023-12-28 | Discharge: 2023-12-28 | Disposition: A | Source: Ambulatory Visit | Attending: Family Medicine | Admitting: Family Medicine

## 2023-12-28 DIAGNOSIS — Z1231 Encounter for screening mammogram for malignant neoplasm of breast: Secondary | ICD-10-CM

## 2024-02-21 ENCOUNTER — Ambulatory Visit (HOSPITAL_BASED_OUTPATIENT_CLINIC_OR_DEPARTMENT_OTHER): Admitting: Family

## 2024-02-21 ENCOUNTER — Encounter (HOSPITAL_BASED_OUTPATIENT_CLINIC_OR_DEPARTMENT_OTHER): Payer: Self-pay | Admitting: Family

## 2024-02-21 VITALS — BP 136/72 | HR 67 | Ht 67.0 in | Wt 131.1 lb

## 2024-02-21 DIAGNOSIS — I2510 Atherosclerotic heart disease of native coronary artery without angina pectoris: Secondary | ICD-10-CM

## 2024-02-21 DIAGNOSIS — E785 Hyperlipidemia, unspecified: Secondary | ICD-10-CM | POA: Diagnosis not present

## 2024-02-21 MED ORDER — ROSUVASTATIN CALCIUM 20 MG PO TABS: 20.0000 mg | ORAL_TABLET | Freq: Every day | ORAL | 3 refills | Status: AC

## 2024-02-21 NOTE — Patient Instructions (Signed)
 Medication Instructions:   START Rosuvastatin  (Crestor ) 20 mg by mouth daily  *If you need a refill on your cardiac medications before your next appointment, please call your pharmacy*  Lab Work:  Return for lab work in 4-6 months here at [facility] (on the 3rd floor [address])--we will check lipids at that time--PLEASE COME FASTING TO THIS LAB APPOINTMENT  If you have labs (blood work) drawn today and your tests are completely normal, you will receive your results only by: MyChart Message (if you have MyChart) OR A paper copy in the mail If you have any lab test that is abnormal or we need to change your treatment, we will call you to review the results.    Follow-Up:  Your next appointment:   1 year(s)  Provider:   Annabella Scarce, MD, Rosaline Bane, NP, or Reche Finder, NP      Other Instructions  You have been referred to P.R.E.P Program--Provider Referred Exercise Program--they will be in contact with you, if deemed a candidate for their program through the [facility]

## 2024-02-21 NOTE — Progress Notes (Signed)
 " Cardiology Office Note   Date:  02/21/2024  ID:  Erika Wilkins, DOB 07-15-1955, MRN [id number] PCP: Erika Pellet, MD  [facility] Providers Cardiologist:  Annabella Scarce, MD     History of Present Illness Erika Wilkins is a 69 y.o. female with history of CAD, HLD.  Family history notable for heart disease with her mother passing away at 19 years old due to heart attack.  Previously evaluated by Dr. Court for chest pain.  GXT negative for ischemia in 2020.  Has since transitioned care to Dr. Scarce.  She had chronic calcium  score 6 this 2024 with score of 75.4 placing her in the 97th percentile.  Updated cardiac CTA 09/2022 calcium  score 49.2 placing her in the 67th percentile with DPV 84 mm in the 33rd percentile.  There was a minimal 0-24% stenosis in the LM and LAD.  12/2022 LP(a) less than 8.4.  Presents today for follow up. 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 Episodes of chest pain a few months ago upon waking would have right sided chest pain. She attributed it to stress. Reassurance provided atypical for cardiac. Labs with PCP 11/01/23 TC 180, HDL 82, LDL 83, trig 86. Reviewed normal Lp(a) and LDL goal. Reviewed CCTA images.   ROS: Please see the history of present illness.    All other systems reviewed and are negative.   Studies Reviewed EKG Interpretation Date/Time:  Tuesday February 21 2024 13:47:01 EST Ventricular Rate:  66 PR Interval:  112 QRS Duration:  82 QT Interval:  382 QTC Calculation: 400 R Axis:   76  Text Interpretation: Normal sinus rhythm Nonspecific ST and T wave abnormality  No acute ST/T wave changes. Confirmed by Vannie Mora ([id number]) on 02/21/2024 2:03:18 PM    Cardiac Studies & Procedures   ______________________________________________________________________________________________   STRESS TESTS  EXERCISE TOLERANCE TEST (ETT) 02/02/2018  Interpretation Summary  The patient walked on a Bruce protocol treadmill test for  11 minutes. She achieved a peak heart rate of 148 which is 94% predicted maximal heart rate.  At peak exercise there were no ST or T wave changes to suggest ischemia.  Blood pressure response to exercise was normal.  This is interpreted as a negative stress test. She has no evidence of ischemia. There is no QRS widening at peak exercise.        CT SCANS  CT CORONARY MORPH W/CTA COR W/SCORE 10/06/2022  Addendum 10/18/2022  4:33 PM ADDENDUM REPORT: 10/18/2022 16:31  EXAM: OVER-READ INTERPRETATION  CT CHEST  The following report is an over-read performed by radiologist Dr. Fonda Mom [facility] on 10/18/2022. This over-read does not include interpretation of cardiac or coronary anatomy or pathology. The coronary CTA interpretation by the cardiologist is attached.  COMPARISON:  06/30/2022.  FINDINGS: Cardiovascular: Small pericardial effusion unchanged. See findings discussed in the body of the report.  Mediastinum/Nodes: No suspicious adenopathy identified. Imaged mediastinal structures are unremarkable.  Lungs/Pleura: Imaged lungs are clear. No pleural effusion or pneumothorax.  Upper Abdomen: No acute abnormality.  Musculoskeletal: No chest wall abnormality. No acute or significant osseous findings.  IMPRESSION: No significant extracardiac incidental findings identified.   Electronically Signed By: Fonda Field M.D. On: 10/18/2022 16:31  Narrative CLINICAL DATA:  69 yo female with DOE  EXAM: Cardiac/Coronary CTA  TECHNIQUE: A non-contrast, gated CT scan was obtained with axial slices of 3 mm through the heart for calcium  scoring. Calcium  scoring was performed using the Agatston method. A 120 kV prospective,  gated, contrast cardiac scan was obtained. Gantry rotation speed was 250 msecs and collimation was 0.6 mm. Two sublingual nitroglycerin  tablets (0.8 mg) were given. The 3D data set was reconstructed in 5% intervals of the 35-75%  of the R-R cycle. Diastolic phases were analyzed on a dedicated workstation using MPR, MIP, and VRT modes. The patient received 95 cc of contrast.  FINDINGS: Image quality: Excellent.  Noise artifact is: Limited.  Coronary Arteries:  Normal coronary origin.  Right dominance.  Left main: The left main is a short, large caliber vessel with a normal take off from the left coronary cusp that bifurcates to form a left anterior descending artery and a left circumflex artery. There is minimal (0-24) plaque in the distal LM.  Left anterior descending artery: The LAD has minimal (0-24) plaque in the proximal and mid vessel. The LAD gives off 2 small, patent diagonal branches.  Left circumflex artery: The LCX is non-dominant and patent with no evidence of plaque or stenosis. The LCX gives off large, patent OM1 and small patent OM2.  Right coronary artery: The RCA is dominant with normal take off from the right coronary cusp. There is no evidence of plaque or stenosis. The RCA terminates as a PDA and right posterolateral branch without evidence of plaque or stenosis.  Right Atrium: Right atrial size is within normal limits.  Right Ventricle: The right ventricular cavity is within normal limits.  Left Atrium: Left atrial size is normal in size with no left atrial appendage filling defect.  Left Ventricle: The ventricular cavity size is within normal limits.  Pulmonary arteries: Normal in size.  Pulmonary veins: Normal pulmonary venous drainage.  Pericardium: Normal thickness without significant effusion or calcium  present.  Cardiac valves: The aortic valve is trileaflet without significant calcification. The mitral valve is normal without significant calcification.  Aorta: Mild aortic atherosclerosis.  Extra-cardiac findings: See attached radiology report for non-cardiac structures.  IMPRESSION: 1. Coronary calcium  score of 49.2. This was 75 percentile for age-, sex, and  race-matched controls.  2. Total plaque volume 84 mm3 which is 33 percentile for age- and sex-matched controls (calcified plaque 8 mm3; non-calcified plaque 76 mm3). TPV is (mild).  3. Normal coronary origin with right dominance.  4. Minimal (0-24) plaque in the LM and LAD.  5. Aortic atherosclerosis.  RECOMMENDATIONS: CAD-RADS 1: Minimal non-obstructive CAD (0-24%). Consider non-atherosclerotic causes of chest pain. Consider preventive therapy and risk factor modification.  Redell Shallow, MD  Electronically Signed: By: Redell Shallow M.D. On: 10/06/2022 17:05   CT SCANS  CT CARDIAC SCORING (SELF PAY ONLY) 06/30/2022  Addendum 07/08/2022 10:53 PM ADDENDUM REPORT: 07/08/2022 22:51  EXAM: OVER-READ INTERPRETATION  CT CHEST  The following report is an over-read performed by radiologist Dr. Oneil Devonshire of [facility] on 07/08/2022. This over-read does not include interpretation of cardiac or coronary anatomy or pathology. The coronary calcium  score interpretation by the cardiologist is attached.  COMPARISON:  None.  FINDINGS: Cardiovascular: There are no significant extracardiac vascular findings.  Mediastinum/Nodes: There are no enlarged lymph nodes within the visualized mediastinum.  Lungs/Pleura: There is no pleural effusion. The visualized lungs appear clear.  Upper abdomen: No significant findings in the visualized upper abdomen.  Musculoskeletal/Chest wall: No chest wall mass or suspicious osseous findings within the visualized chest.  IMPRESSION: No significant extracardiac findings within the visualized chest.   Electronically Signed By: Oneil Devonshire M.D. On: 07/08/2022 22:51  Narrative CLINICAL DATA:  Cardiovascular Disease Risk stratification  EXAM:  Coronary Calcium  Score  MEDICATIONS: MEDICATIONS None  TECHNIQUE: A gated, non-contrast computed tomography scan of the heart was performed using 3mm slice thickness. Axial  images were analyzed on a dedicated workstation. Calcium  scoring of the coronary arteries was performed using the Agatston method.  FINDINGS: Coronary arteries: Normal origins.  Coronary Calcium  Score:  Left main: 24.8  Left anterior descending artery: 50.6  Left circumflex artery: 0  Right coronary artery: 0  Total: 75.4  Percentile: 97th  Pericardium: Normal.  Ascending Aorta: Normal caliber.  Non-cardiac: See separate report from [facility].  IMPRESSION: Coronary calcium  score of 75.4 Agatston units. This was 97th percentile for age-, race-, and sex-matched controls.  RECOMMENDATIONS: Coronary artery calcium  (CAC) score is a strong predictor of incident coronary heart disease (CHD) and provides predictive information beyond traditional risk factors. CAC scoring is reasonable to use in the decision to withhold, postpone, or initiate statin therapy in intermediate-risk or selected borderline-risk asymptomatic adults (age 33-75 years and LDL-C >=70 to <190 mg/dL) who do not have diabetes or established atherosclerotic cardiovascular disease (ASCVD).* In intermediate-risk (10-year ASCVD risk >=7.5% to <20%) adults or selected borderline-risk (10-year ASCVD risk >=5% to <7.5%) adults in whom a CAC score is measured for the purpose of making a treatment decision the following recommendations have been made:  If CAC=0, it is reasonable to withhold statin therapy and reassess in 5 to 10 years, as long as higher risk conditions are absent (diabetes mellitus, family history of premature CHD in first degree relatives (males <55 years; females <65 years), cigarette smoking, or LDL >=190 mg/dL).  If CAC is 1 to 99, it is reasonable to initiate statin therapy for patients >=60 years of age.  If CAC is >=100 or >=75th percentile, it is reasonable to initiate statin therapy at any age.  Cardiology referral should be considered for patients with CAC scores >=400  or >=75th percentile.  *2018 AHA/ACC/AACVPR/AAPA/ABC/ACPM/ADA/AGS/APhA/ASPC/NLA/PCNA Guideline on the Management of Blood Cholesterol: A Report of the American College of Cardiology/American Heart Association Task Force on Clinical Practice Guidelines. J Am Coll Cardiol. 2019;73(24):3168-3209.  Ezra Shuck, MD  Electronically Signed: By: Ezra Shuck M.D. On: 06/30/2022 20:09     ______________________________________________________________________________________________      Risk Assessment/Calculations           Physical Exam VS:  BP 136/72   Pulse 67   Ht 5' 7 (1.702 m)   Wt 131 lb 1.6 oz (59.5 kg)   SpO2 99%   BMI 20.53 kg/m        Wt Readings from Last 3 Encounters:  02/21/24 131 lb 1.6 oz (59.5 kg)  07/23/23 131 lb (59.4 kg)  09/29/22 124 lb (56.2 kg)    GEN: Well nourished, well developed in no acute distress NECK: No JVD; No carotid bruits CARDIAC: RRR, no murmurs, rubs, gallops RESPIRATORY:  Clear to auscultation without rales, wheezing or rhonchi  ABDOMEN: Soft, non-tender, non-distended EXTREMITIES:  No edema; No deformity   ASSESSMENT AND PLAN  Nonobstructive CAD - Stable with no anginal symptoms. No indication for ischemic evaluation.  GDMT Rosuvastatin  20mg  daily. Recommend aiming for 150 minutes of moderate intensity activity per week and following a heart healthy diet.     HLD, LDL goal <70 - 10/2023 LDL 83. Discussed LDL goal <70. Prior Lp(a) unremarkable. Prefers to work on lifestyle changes. Continue Rosuvastatin  20mg  daily, refills provided. Refer to PREP exercise program. Dietary handouts provided. Update lipid panel in 4-6 mos. If LDL not at goal <70, plan  to increase Rosuvastatin  at that time.        Dispo: follow up in 1 year with Dr. Raford  Signed, Reche GORMAN Finder, NP   "

## 2024-02-23 ENCOUNTER — Telehealth (HOSPITAL_BASED_OUTPATIENT_CLINIC_OR_DEPARTMENT_OTHER): Payer: Self-pay | Admitting: *Deleted

## 2024-02-23 NOTE — Telephone Encounter (Signed)
 RE: PREP Received: Nilsa Kiki Adonna KATHEE, RN  Mancil Doffing; Geoffrey Iha; Vinie Lame Cc: Gladis Porter HERO, LPN We will give her a call, thanks! Lora       Previous Messages    ----- Message ----- From: Mancil Doffing Sent: 02/22/2024  12:15 PM EST To: Porter HERO Gladis, LPN; Adonna KATHEE Kiki, RN; Carson* Subject: PREP                                          Hi Luke Adonna and Lame Please call See message below.    [facility] or [facility]? Yes [facility]  Thank you  Doffing Mancil, BLANCH CAULK [facility]  [facility], [facility] Management Assistant [phone]  Order #: [id number] Procedure: Amb Referral To Provider Referral Exercise Program (P.R.E.P) Order Date: 02/21/2024 Proc Category: [facility] Out[facility] Ref [place] Priority: Routine Status: Sent Class: Internal Referral Ordering User: Gladis Porter HERO, LPN Auth Provider: VANNIE RECHE GORMAN [place] Provider: Vannie Reche GORMAN, NP Diagnosis: Nonobstructive atherosclerosis of coronary artery Department: [facility]  Reason for referral Inactivity Family History High Cholesterol

## 2024-02-27 ENCOUNTER — Telehealth: Payer: Self-pay

## 2024-02-27 NOTE — Telephone Encounter (Signed)
 Called  re: PREP program referral, left voicemail requesting return call.

## 2024-02-28 ENCOUNTER — Telehealth: Payer: Self-pay

## 2024-02-28 NOTE — Telephone Encounter (Signed)
 Returned her call, explained PREP, works part time she would like to attend T/[employer] 12 noon class in March; will contact first of next month to confirm date and set up assessment visit.

## 2024-03-02 ENCOUNTER — Encounter: Payer: Self-pay | Admitting: *Deleted

## 2024-03-02 LAB — GLUCOSE, POCT (MANUAL RESULT ENTRY): Glucose Fasting, POC: 83 mg/dL (ref 70–99)

## 2024-03-02 NOTE — Progress Notes (Signed)
 Patient attended screening event on 03/02/24, BP was 122/73 and blood glucose was 83. No SDOH needs

## 2024-05-24 ENCOUNTER — Other Ambulatory Visit (HOSPITAL_BASED_OUTPATIENT_CLINIC_OR_DEPARTMENT_OTHER)
